# Patient Record
Sex: Female | Born: 1955 | Race: Black or African American | Hispanic: No
Health system: Southern US, Community
[De-identification: ages and names within clinical notes are randomized; demographics above are authoritative.]

---

## 1996-02-16 DIAGNOSIS — K5090 Crohn's disease, unspecified, without complications: Secondary | ICD-10-CM | POA: Insufficient documentation

## 2001-08-20 ENCOUNTER — Encounter: Payer: Self-pay | Admitting: Emergency Medicine

## 2001-08-21 ENCOUNTER — Inpatient Hospital Stay (HOSPITAL_COMMUNITY): Admission: EM | Admit: 2001-08-21 | Discharge: 2001-08-25 | Payer: Self-pay | Admitting: Emergency Medicine

## 2001-08-21 ENCOUNTER — Encounter: Payer: Self-pay | Admitting: Gastroenterology

## 2001-10-06 ENCOUNTER — Ambulatory Visit (HOSPITAL_COMMUNITY): Admission: RE | Admit: 2001-10-06 | Discharge: 2001-10-06 | Payer: Self-pay | Admitting: Gastroenterology

## 2001-10-06 ENCOUNTER — Encounter (INDEPENDENT_AMBULATORY_CARE_PROVIDER_SITE_OTHER): Payer: Self-pay | Admitting: *Deleted

## 2001-10-13 ENCOUNTER — Encounter: Payer: Self-pay | Admitting: Gastroenterology

## 2001-10-13 ENCOUNTER — Encounter: Admission: RE | Admit: 2001-10-13 | Discharge: 2001-10-13 | Payer: Self-pay | Admitting: Gastroenterology

## 2002-02-02 ENCOUNTER — Other Ambulatory Visit: Admission: RE | Admit: 2002-02-02 | Discharge: 2002-02-02 | Payer: Self-pay | Admitting: Family Medicine

## 2002-07-23 ENCOUNTER — Emergency Department (HOSPITAL_COMMUNITY): Admission: EM | Admit: 2002-07-23 | Discharge: 2002-07-24 | Payer: Self-pay | Admitting: Emergency Medicine

## 2002-07-24 ENCOUNTER — Encounter: Payer: Self-pay | Admitting: Emergency Medicine

## 2003-01-02 ENCOUNTER — Emergency Department (HOSPITAL_COMMUNITY): Admission: EM | Admit: 2003-01-02 | Discharge: 2003-01-02 | Payer: Self-pay | Admitting: Emergency Medicine

## 2003-01-02 ENCOUNTER — Encounter: Payer: Self-pay | Admitting: Emergency Medicine

## 2003-02-21 ENCOUNTER — Inpatient Hospital Stay (HOSPITAL_COMMUNITY): Admission: EM | Admit: 2003-02-21 | Discharge: 2003-02-23 | Payer: Self-pay | Admitting: Emergency Medicine

## 2003-02-21 ENCOUNTER — Encounter: Payer: Self-pay | Admitting: Emergency Medicine

## 2003-02-22 ENCOUNTER — Encounter: Payer: Self-pay | Admitting: Gastroenterology

## 2003-03-29 ENCOUNTER — Inpatient Hospital Stay (HOSPITAL_COMMUNITY): Admission: EM | Admit: 2003-03-29 | Discharge: 2003-04-02 | Payer: Self-pay | Admitting: Emergency Medicine

## 2003-10-19 ENCOUNTER — Emergency Department (HOSPITAL_COMMUNITY): Admission: EM | Admit: 2003-10-19 | Discharge: 2003-10-19 | Payer: Self-pay | Admitting: Emergency Medicine

## 2003-10-24 ENCOUNTER — Emergency Department (HOSPITAL_COMMUNITY): Admission: EM | Admit: 2003-10-24 | Discharge: 2003-10-25 | Payer: Self-pay | Admitting: Emergency Medicine

## 2003-10-26 ENCOUNTER — Inpatient Hospital Stay (HOSPITAL_COMMUNITY): Admission: EM | Admit: 2003-10-26 | Discharge: 2003-10-29 | Payer: Self-pay | Admitting: Emergency Medicine

## 2003-10-29 ENCOUNTER — Encounter: Payer: Self-pay | Admitting: Internal Medicine

## 2003-10-29 ENCOUNTER — Encounter (INDEPENDENT_AMBULATORY_CARE_PROVIDER_SITE_OTHER): Payer: Self-pay | Admitting: Specialist

## 2003-10-29 DIAGNOSIS — K5730 Diverticulosis of large intestine without perforation or abscess without bleeding: Secondary | ICD-10-CM | POA: Insufficient documentation

## 2003-11-10 ENCOUNTER — Ambulatory Visit (HOSPITAL_COMMUNITY): Admission: RE | Admit: 2003-11-10 | Discharge: 2003-11-10 | Payer: Self-pay | Admitting: Internal Medicine

## 2003-11-18 ENCOUNTER — Inpatient Hospital Stay (HOSPITAL_COMMUNITY): Admission: EM | Admit: 2003-11-18 | Discharge: 2003-11-29 | Payer: Self-pay | Admitting: Emergency Medicine

## 2003-11-24 ENCOUNTER — Encounter: Payer: Self-pay | Admitting: Internal Medicine

## 2003-11-24 ENCOUNTER — Encounter (INDEPENDENT_AMBULATORY_CARE_PROVIDER_SITE_OTHER): Payer: Self-pay | Admitting: Specialist

## 2004-02-05 ENCOUNTER — Encounter: Admission: RE | Admit: 2004-02-05 | Discharge: 2004-02-05 | Payer: Self-pay | Admitting: Internal Medicine

## 2004-03-06 ENCOUNTER — Encounter: Admission: RE | Admit: 2004-03-06 | Discharge: 2004-04-24 | Payer: Self-pay | Admitting: Internal Medicine

## 2004-03-08 ENCOUNTER — Encounter: Admission: RE | Admit: 2004-03-08 | Discharge: 2004-06-06 | Payer: Self-pay

## 2004-05-19 ENCOUNTER — Ambulatory Visit: Payer: Self-pay | Admitting: Internal Medicine

## 2004-05-20 ENCOUNTER — Inpatient Hospital Stay (HOSPITAL_COMMUNITY): Admission: EM | Admit: 2004-05-20 | Discharge: 2004-05-31 | Payer: Self-pay | Admitting: Emergency Medicine

## 2004-06-22 ENCOUNTER — Ambulatory Visit: Payer: Self-pay | Admitting: Gastroenterology

## 2004-06-28 ENCOUNTER — Ambulatory Visit: Payer: Self-pay | Admitting: Internal Medicine

## 2004-10-17 ENCOUNTER — Ambulatory Visit: Payer: Self-pay | Admitting: Internal Medicine

## 2004-11-08 ENCOUNTER — Ambulatory Visit: Payer: Self-pay | Admitting: Internal Medicine

## 2005-01-10 ENCOUNTER — Ambulatory Visit (HOSPITAL_COMMUNITY): Admission: RE | Admit: 2005-01-10 | Discharge: 2005-01-10 | Payer: Self-pay | Admitting: Internal Medicine

## 2005-01-10 ENCOUNTER — Ambulatory Visit: Payer: Self-pay | Admitting: Internal Medicine

## 2005-01-16 ENCOUNTER — Encounter: Admission: RE | Admit: 2005-01-16 | Discharge: 2005-01-16 | Payer: Self-pay | Admitting: Internal Medicine

## 2005-01-26 ENCOUNTER — Encounter: Admission: RE | Admit: 2005-01-26 | Discharge: 2005-01-26 | Payer: Self-pay | Admitting: Internal Medicine

## 2005-04-24 ENCOUNTER — Ambulatory Visit: Payer: Self-pay | Admitting: Internal Medicine

## 2005-05-25 ENCOUNTER — Ambulatory Visit: Payer: Self-pay | Admitting: Physical Medicine and Rehabilitation

## 2005-05-25 ENCOUNTER — Encounter
Admission: RE | Admit: 2005-05-25 | Discharge: 2005-08-23 | Payer: Self-pay | Admitting: Physical Medicine and Rehabilitation

## 2005-06-22 ENCOUNTER — Ambulatory Visit: Payer: Self-pay | Admitting: Physical Medicine & Rehabilitation

## 2005-07-26 ENCOUNTER — Ambulatory Visit: Payer: Self-pay | Admitting: Physical Medicine and Rehabilitation

## 2005-08-23 ENCOUNTER — Encounter
Admission: RE | Admit: 2005-08-23 | Discharge: 2005-11-21 | Payer: Self-pay | Admitting: Physical Medicine and Rehabilitation

## 2005-09-11 IMAGING — CR DG ABDOMEN ACUTE W/ 1V CHEST
4 series · 4 of 4 positions shown · non-contrast
Comparison: none

CLINICAL DATA: stomach pain, Crohn?s disease.
 ACUTE ABDOMEN W/CHEST
 Previous study is not immediately available. 
 CHEST:
 The heart and mediastinal contours are normal and the lungs are clear.
 IMPRESSION
 Normal chest.
 FLAT AND ERECT ABDOMEN
 Mild ileus pattern. No free air.  Gallbladder removed. There is a mildly dilated loop of small bowel in the right lower quadrant.  Early bowel obstruction not completely excluded.  Distal rectal gas is present.  No abnormal calcifications are seen.
 Mild ileus pattern with prominent small bowel loop right lower quadrant.  No definite obstruction or free air, however.

[view not recorded (1 of 4)]
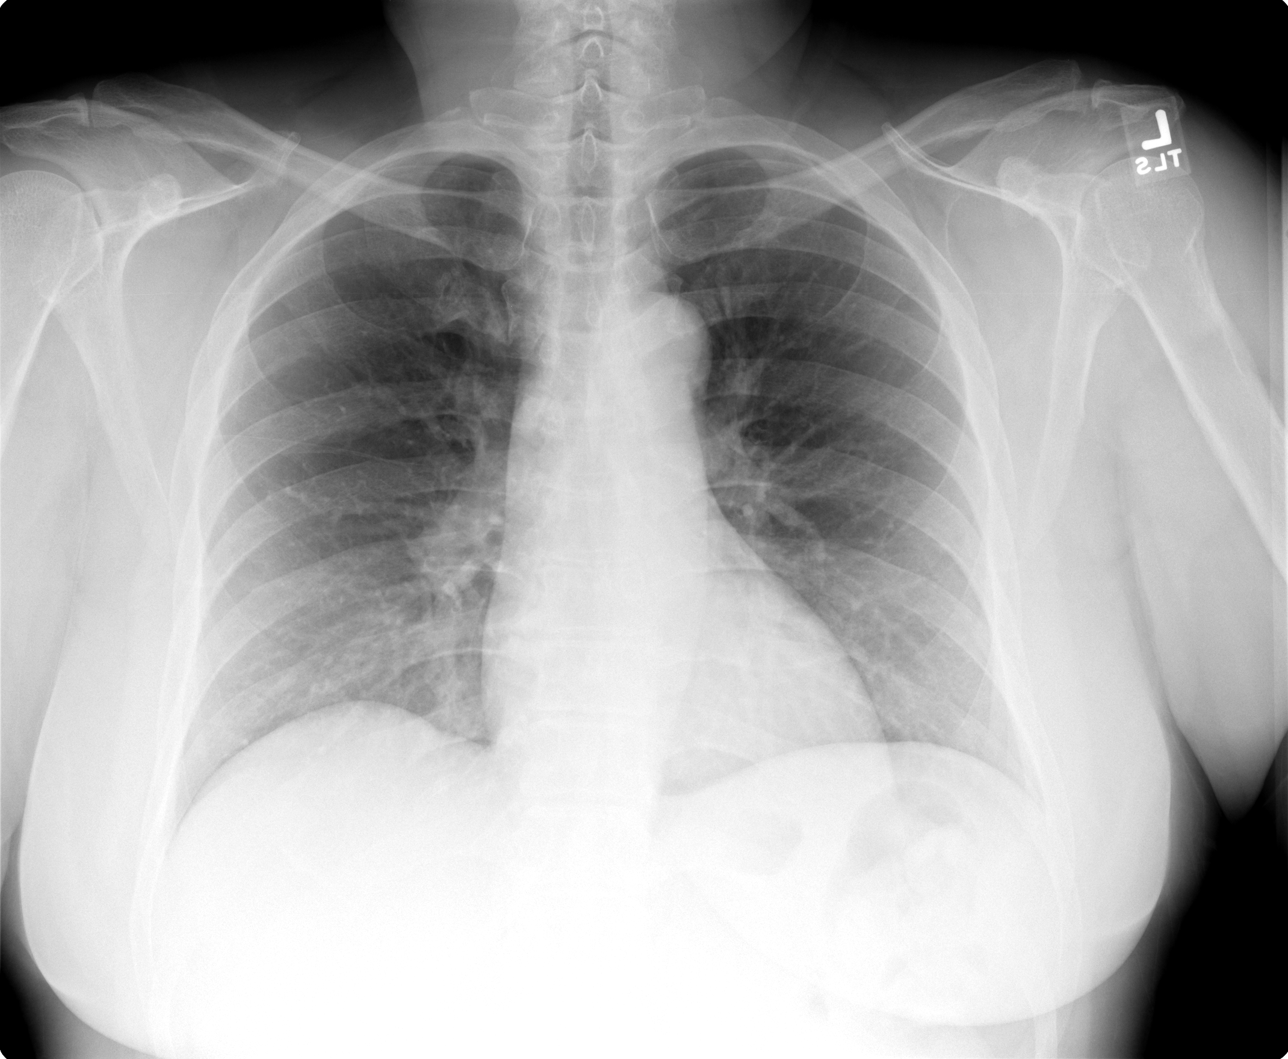

[view not recorded (2 of 4)]
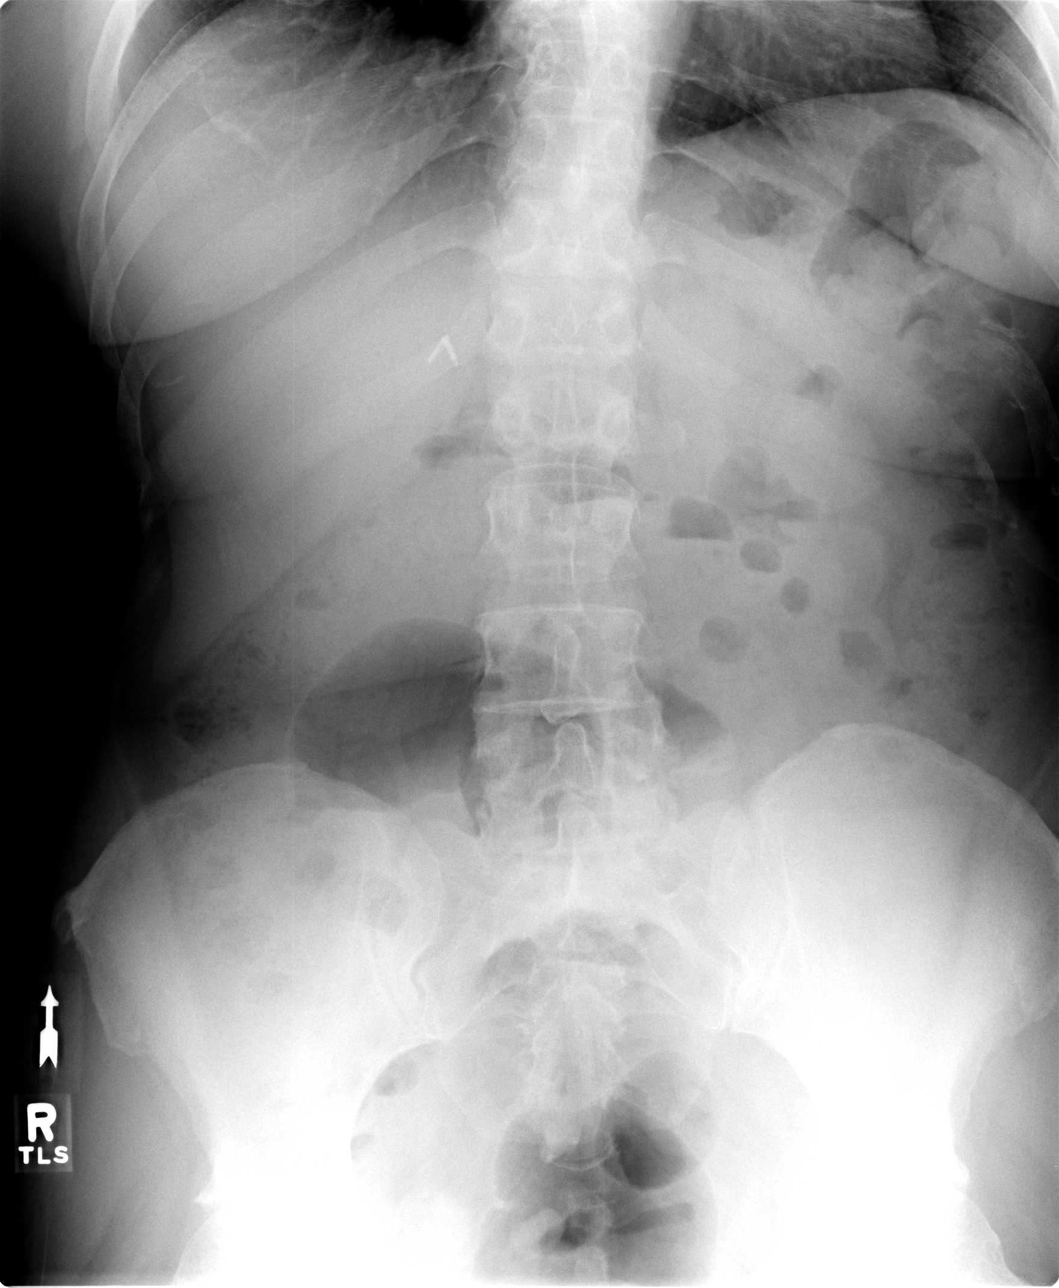

[view not recorded (3 of 4)]
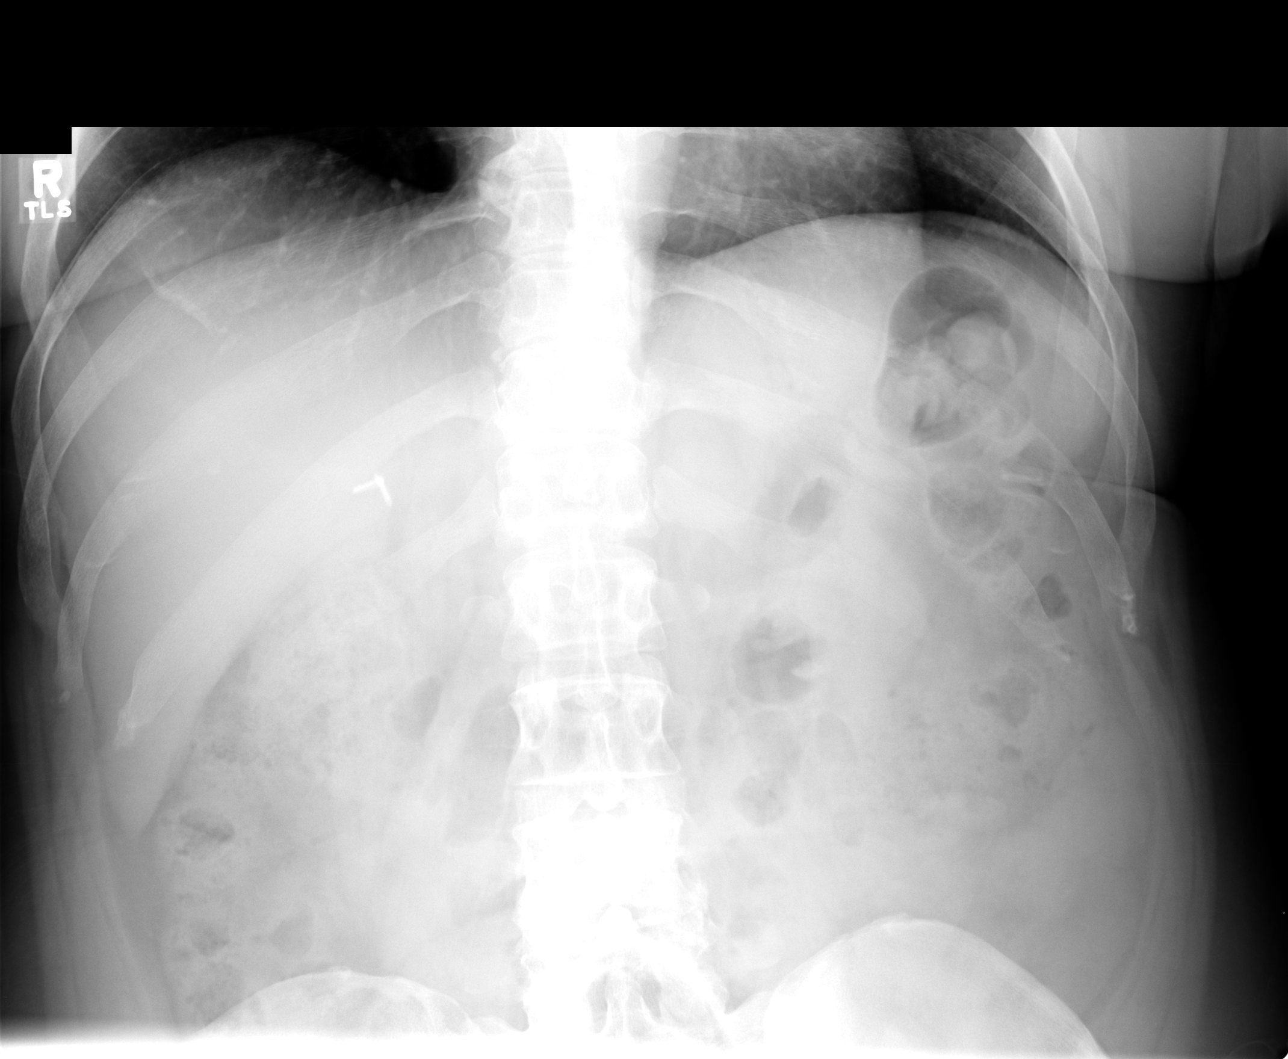

[view not recorded (4 of 4)]
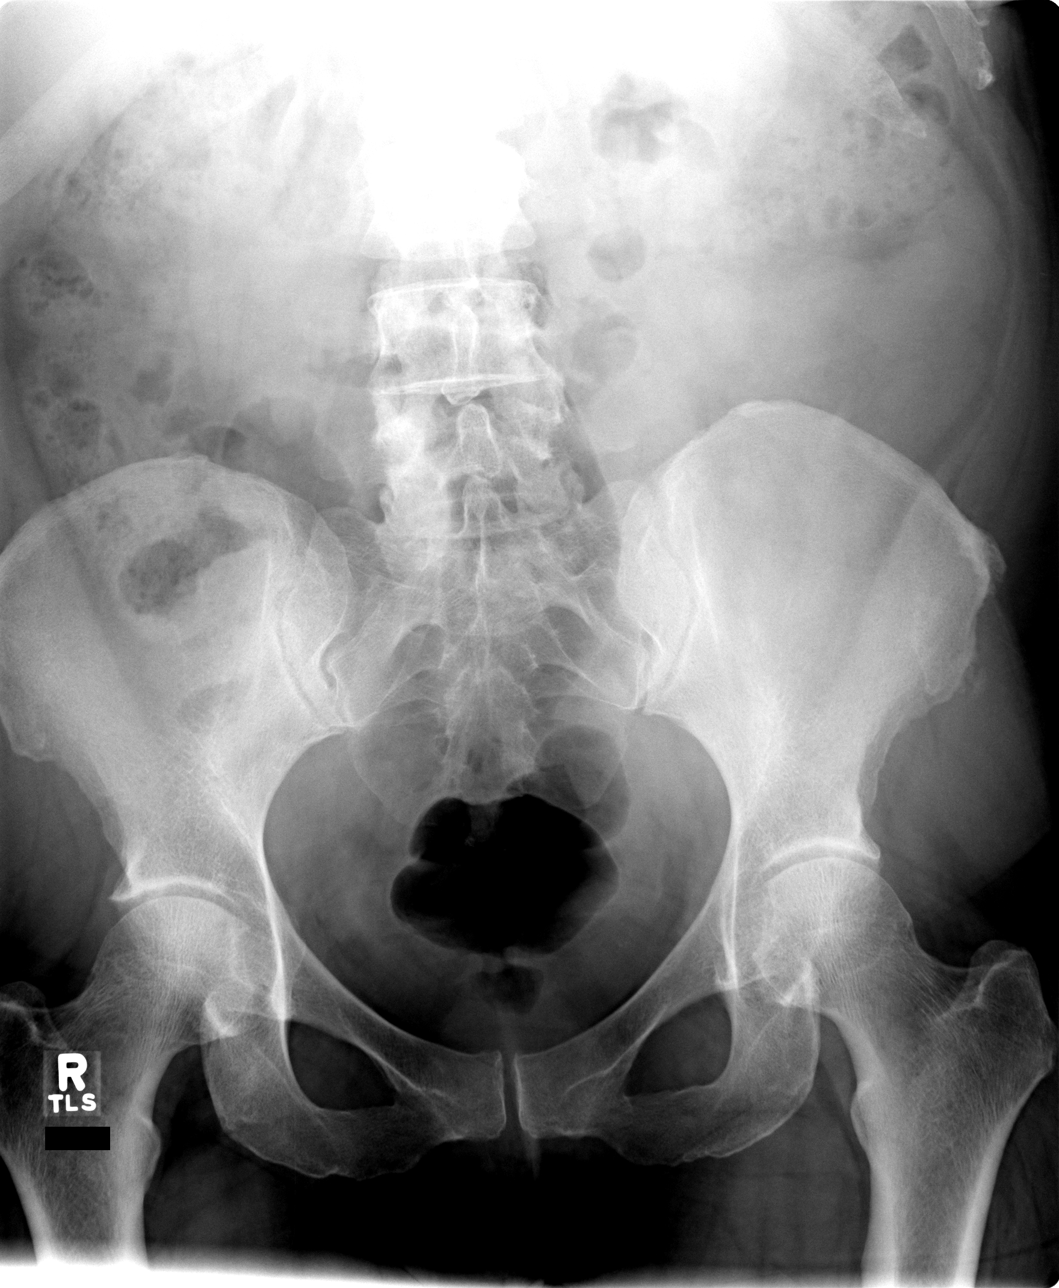

[4 of 4 positions shown; findings below may reference images not displayed]

## 2005-09-20 ENCOUNTER — Ambulatory Visit: Payer: Self-pay | Admitting: Physical Medicine and Rehabilitation

## 2005-10-05 ENCOUNTER — Ambulatory Visit: Payer: Self-pay | Admitting: *Deleted

## 2005-10-18 ENCOUNTER — Ambulatory Visit: Payer: Self-pay | Admitting: Nurse Practitioner

## 2005-12-04 ENCOUNTER — Ambulatory Visit: Payer: Self-pay | Admitting: Nurse Practitioner

## 2005-12-26 ENCOUNTER — Ambulatory Visit: Payer: Self-pay | Admitting: Nurse Practitioner

## 2006-02-07 ENCOUNTER — Emergency Department (HOSPITAL_COMMUNITY): Admission: EM | Admit: 2006-02-07 | Discharge: 2006-02-08 | Payer: Self-pay | Admitting: Emergency Medicine

## 2006-02-25 ENCOUNTER — Ambulatory Visit: Payer: Self-pay | Admitting: Nurse Practitioner

## 2006-07-05 ENCOUNTER — Ambulatory Visit: Payer: Self-pay | Admitting: Nurse Practitioner

## 2006-07-11 ENCOUNTER — Ambulatory Visit (HOSPITAL_COMMUNITY): Admission: RE | Admit: 2006-07-11 | Discharge: 2006-07-11 | Payer: Self-pay | Admitting: Family Medicine

## 2006-08-01 ENCOUNTER — Inpatient Hospital Stay (HOSPITAL_COMMUNITY): Admission: RE | Admit: 2006-08-01 | Discharge: 2006-08-05 | Payer: Self-pay | Admitting: Neurosurgery

## 2006-10-17 ENCOUNTER — Ambulatory Visit: Payer: Self-pay | Admitting: Family Medicine

## 2006-10-18 ENCOUNTER — Ambulatory Visit (HOSPITAL_COMMUNITY): Admission: RE | Admit: 2006-10-18 | Discharge: 2006-10-18 | Payer: Self-pay | Admitting: Internal Medicine

## 2006-11-26 ENCOUNTER — Ambulatory Visit: Payer: Self-pay | Admitting: Internal Medicine

## 2006-12-04 IMAGING — CR DG HIP (WITH OR WITHOUT PELVIS) 2-3V*L*
3 series · 3 of 3 positions shown · non-contrast
Comparison: None.
COMPARISON: None.

CLINICAL DATA: Left hip and back pain ? no known injury. 
 LEFT HIP ? 3 VIEWS:

[t pelvis a.p.]
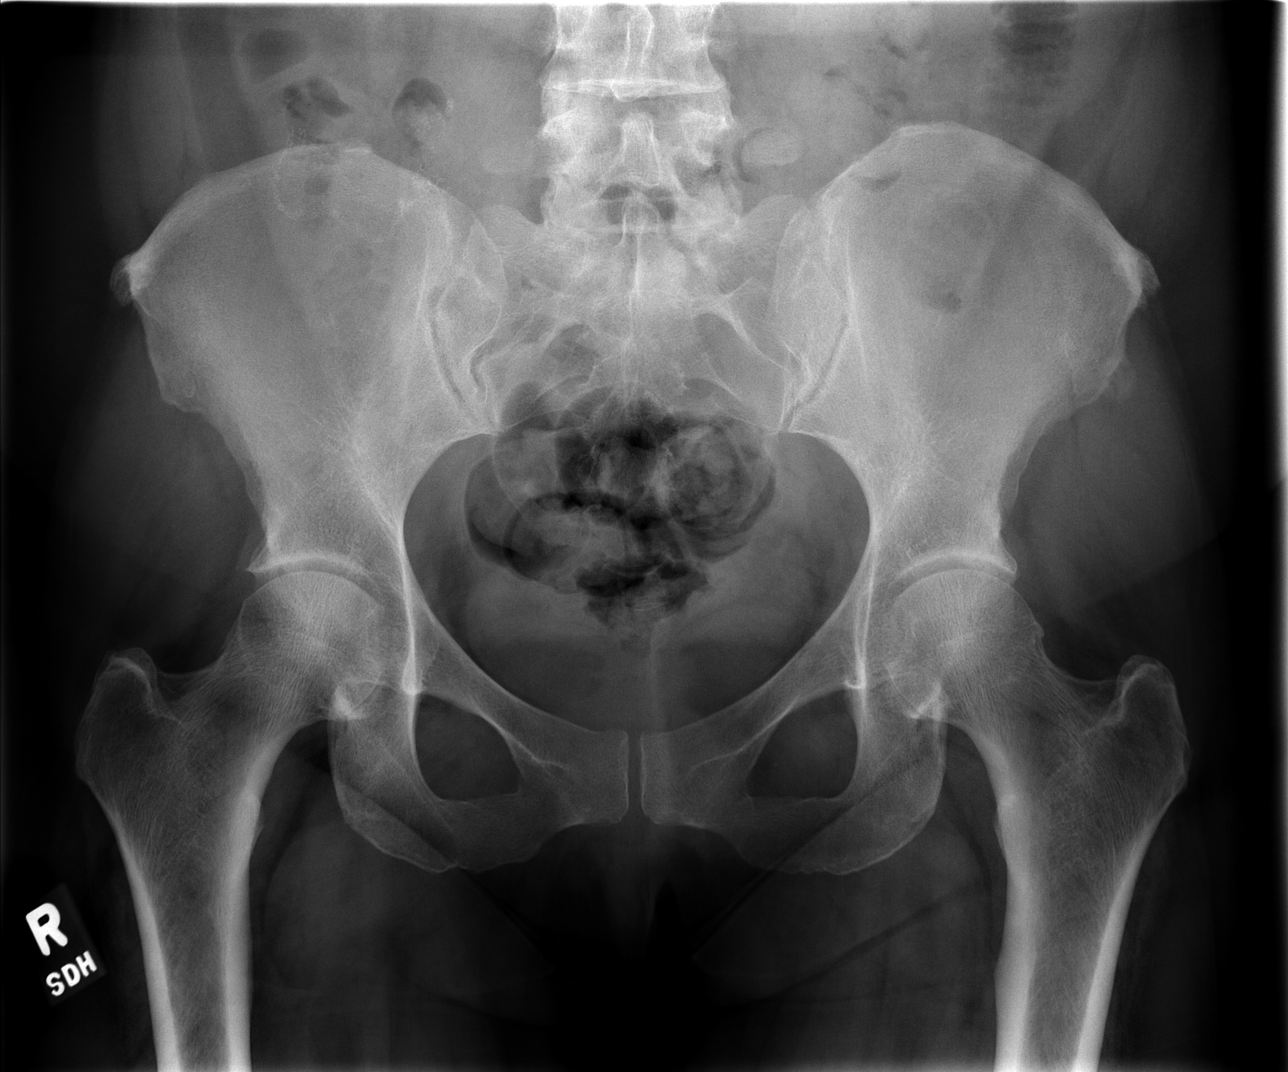

[t hip ap left]
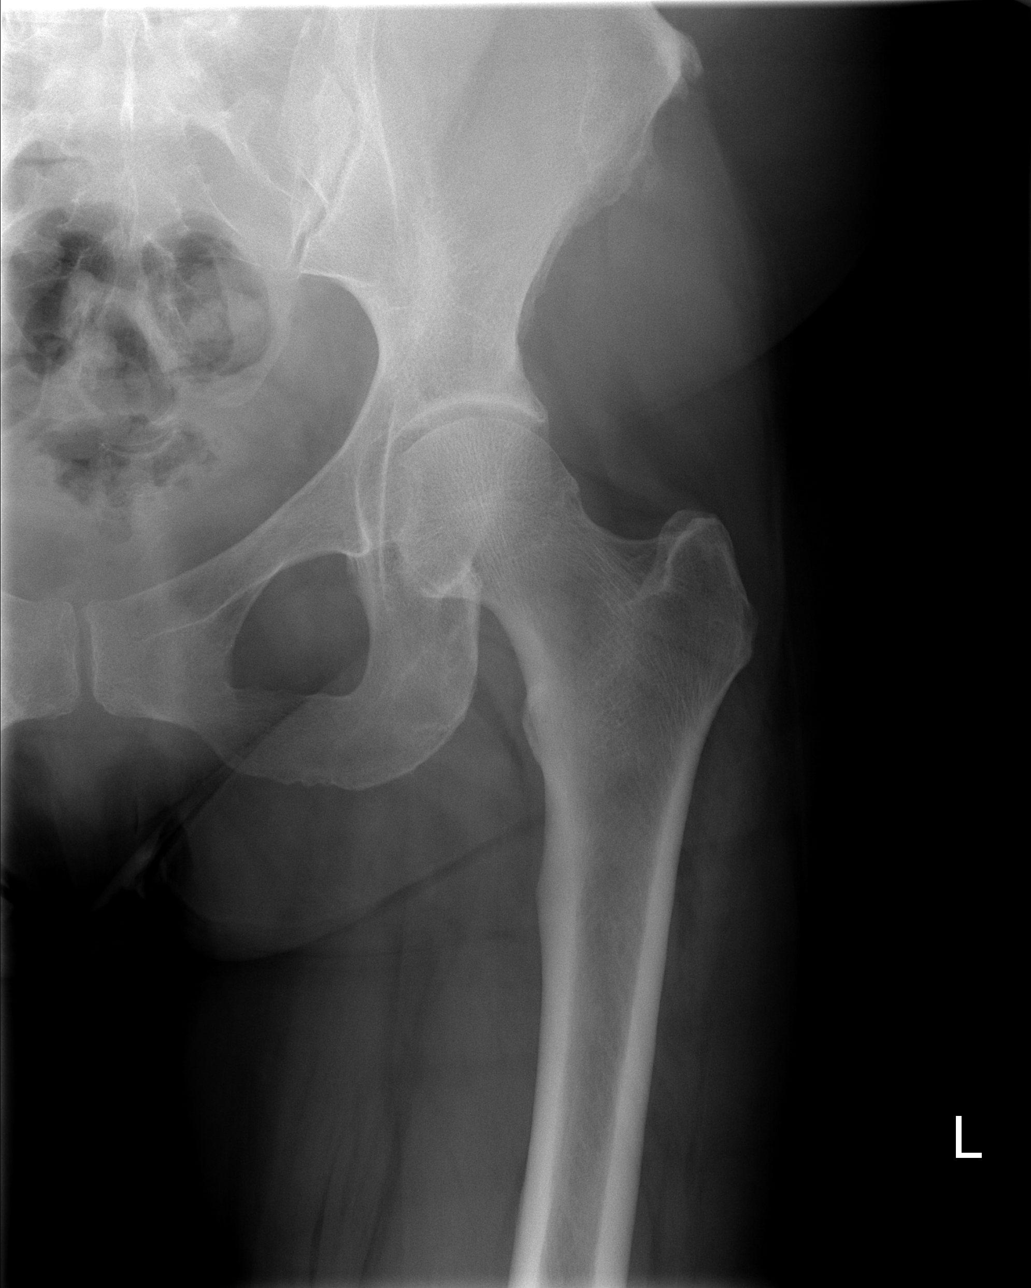

[t hip frog leg left]
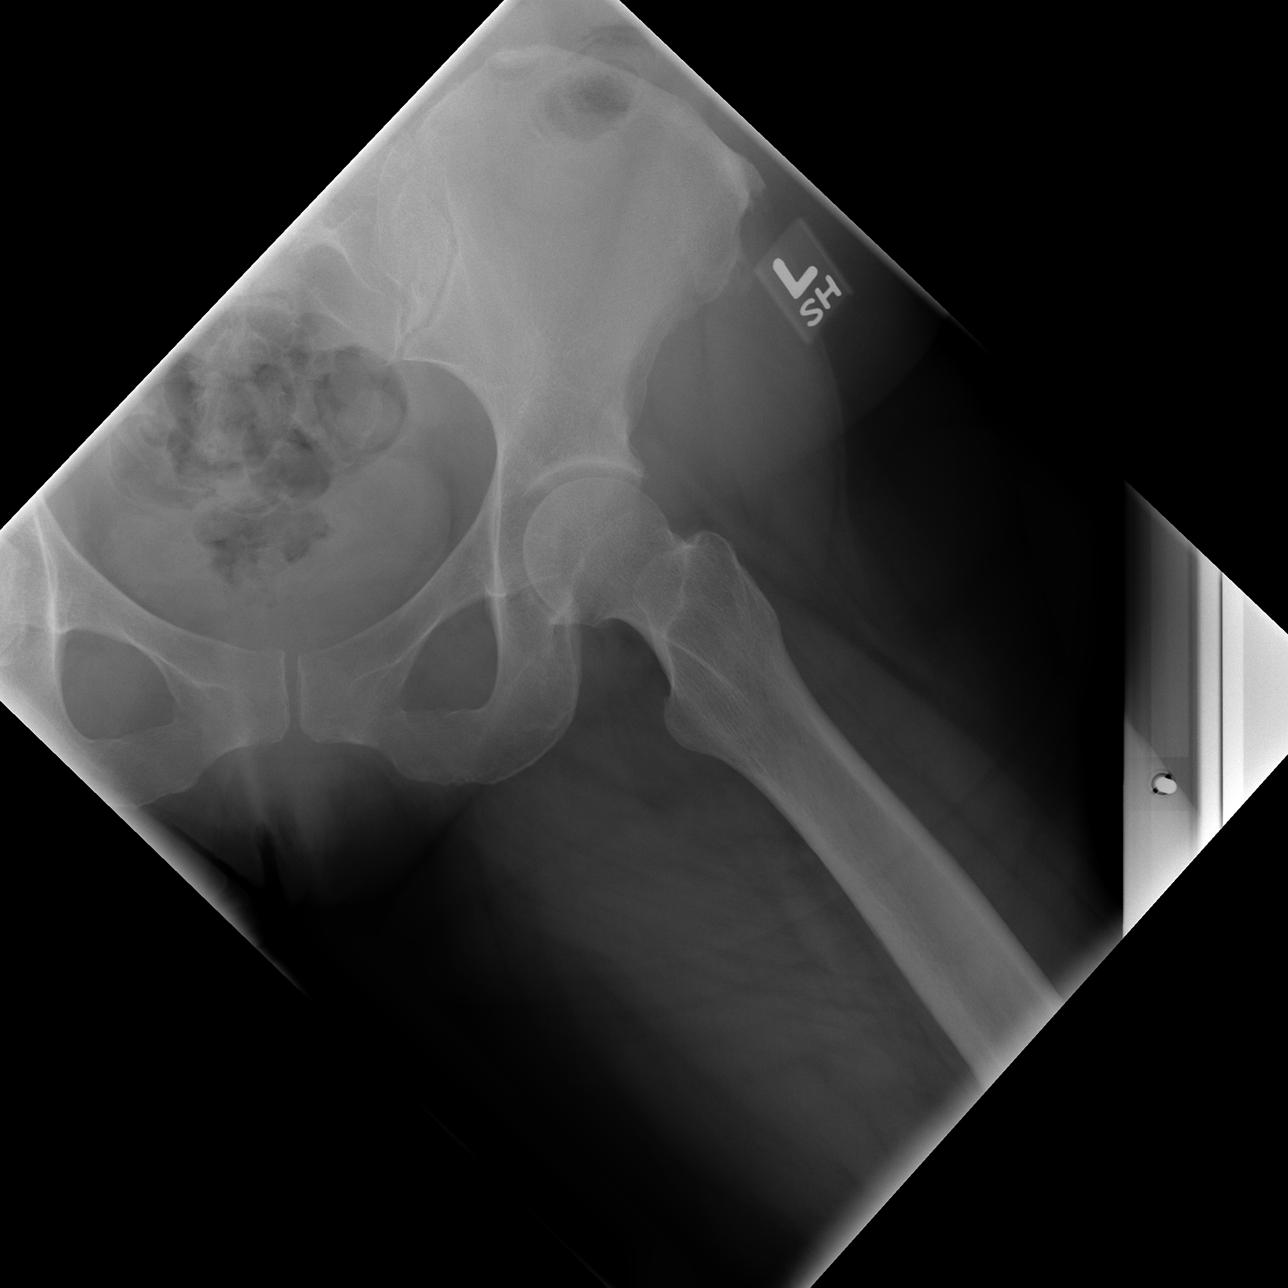

[3 of 3 positions shown; findings below may reference images not displayed]

FINDINGS: No fracture or bony lesions.  No significant osteoarthritis.  Bony pelvic intact.  Soft tissues unremarkable ? there are bowel staples in the right lower quadrant.
IMPRESSION: No acute or significant finding.  
 LUMBAR SPINE ? 4 VIEWS:
FINDINGS: There is about an 8 mm anterolisthesis of L4 on L5 with degenerative disc disease.  There appear to be no pars defects.  This mild degree of anterolisthesis is probably due to facet arthropathy.  There also appears to be some degenerative disc disease at L5-S1.  No acute findings.
IMPRESSION: 1.  Degenerative disc disease at L4-5 and L5-S1.  
 2.  Grade I anterolisthesis of L4 on L5 ? likely due to facet arthropathy.  
 3.  No acute findings.

## 2006-12-06 ENCOUNTER — Ambulatory Visit: Payer: Self-pay | Admitting: Internal Medicine

## 2006-12-06 DIAGNOSIS — K649 Unspecified hemorrhoids: Secondary | ICD-10-CM | POA: Insufficient documentation

## 2006-12-11 ENCOUNTER — Ambulatory Visit: Payer: Self-pay | Admitting: Family Medicine

## 2006-12-11 ENCOUNTER — Ambulatory Visit (HOSPITAL_COMMUNITY): Admission: RE | Admit: 2006-12-11 | Discharge: 2006-12-11 | Payer: Self-pay | Admitting: Family Medicine

## 2006-12-16 ENCOUNTER — Ambulatory Visit: Payer: Self-pay | Admitting: Internal Medicine

## 2006-12-31 ENCOUNTER — Encounter: Payer: Self-pay | Admitting: Internal Medicine

## 2007-01-22 ENCOUNTER — Encounter (INDEPENDENT_AMBULATORY_CARE_PROVIDER_SITE_OTHER): Payer: Self-pay | Admitting: *Deleted

## 2007-06-30 LAB — CONVERTED CEMR LAB: Pap Smear: NORMAL

## 2007-07-16 ENCOUNTER — Ambulatory Visit: Payer: Self-pay | Admitting: Internal Medicine

## 2007-08-20 DIAGNOSIS — I10 Essential (primary) hypertension: Secondary | ICD-10-CM | POA: Insufficient documentation

## 2007-09-12 ENCOUNTER — Emergency Department (HOSPITAL_COMMUNITY): Admission: EM | Admit: 2007-09-12 | Discharge: 2007-09-12 | Payer: Self-pay | Admitting: Emergency Medicine

## 2007-10-20 ENCOUNTER — Ambulatory Visit: Payer: Self-pay | Admitting: Internal Medicine

## 2007-11-12 ENCOUNTER — Ambulatory Visit (HOSPITAL_COMMUNITY): Admission: RE | Admit: 2007-11-12 | Discharge: 2007-11-12 | Payer: Self-pay | Admitting: Family Medicine

## 2008-02-12 ENCOUNTER — Encounter: Payer: Self-pay | Admitting: Internal Medicine

## 2008-02-16 ENCOUNTER — Ambulatory Visit: Payer: Self-pay | Admitting: Internal Medicine

## 2008-02-16 DIAGNOSIS — K6289 Other specified diseases of anus and rectum: Secondary | ICD-10-CM | POA: Insufficient documentation

## 2008-02-16 DIAGNOSIS — R1031 Right lower quadrant pain: Secondary | ICD-10-CM | POA: Insufficient documentation

## 2008-02-16 DIAGNOSIS — R198 Other specified symptoms and signs involving the digestive system and abdomen: Secondary | ICD-10-CM | POA: Insufficient documentation

## 2008-02-16 DIAGNOSIS — K625 Hemorrhage of anus and rectum: Secondary | ICD-10-CM | POA: Insufficient documentation

## 2008-02-16 LAB — CONVERTED CEMR LAB
ALT: 18 units/L (ref 0–35)
Alkaline Phosphatase: 99 units/L (ref 39–117)
Basophils Absolute: 0 10*3/uL (ref 0.0–0.1)
CO2: 26 meq/L (ref 19–32)
Calcium: 9.1 mg/dL (ref 8.4–10.5)
Creatinine, Ser: 1.6 mg/dL — ABNORMAL HIGH (ref 0.4–1.2)
GFR calc Af Amer: 44 mL/min
Glucose, Bld: 104 mg/dL — ABNORMAL HIGH (ref 70–99)
HCT: 38.1 % (ref 36.0–46.0)
MCV: 94.1 fL (ref 78.0–100.0)
Neutrophils Relative %: 62.5 % (ref 43.0–77.0)
Platelets: 252 10*3/uL (ref 150–400)
Potassium: 3.8 meq/L (ref 3.5–5.1)
RBC: 4.05 M/uL (ref 3.87–5.11)
Total Bilirubin: 0.6 mg/dL (ref 0.3–1.2)
Total Protein: 7.5 g/dL (ref 6.0–8.3)
WBC: 5.9 10*3/uL (ref 4.5–10.5)

## 2008-02-17 ENCOUNTER — Ambulatory Visit: Payer: Self-pay | Admitting: Internal Medicine

## 2008-02-17 ENCOUNTER — Encounter: Payer: Self-pay | Admitting: Internal Medicine

## 2008-02-19 ENCOUNTER — Telehealth: Payer: Self-pay | Admitting: Internal Medicine

## 2008-02-19 DIAGNOSIS — R195 Other fecal abnormalities: Secondary | ICD-10-CM | POA: Insufficient documentation

## 2008-02-26 ENCOUNTER — Encounter: Payer: Self-pay | Admitting: Internal Medicine

## 2008-02-27 ENCOUNTER — Ambulatory Visit: Payer: Self-pay | Admitting: Internal Medicine

## 2008-03-04 ENCOUNTER — Telehealth: Payer: Self-pay | Admitting: Internal Medicine

## 2008-05-03 ENCOUNTER — Ambulatory Visit: Payer: Self-pay | Admitting: Internal Medicine

## 2008-05-13 ENCOUNTER — Encounter: Admission: RE | Admit: 2008-05-13 | Discharge: 2008-06-02 | Payer: Self-pay | Admitting: Internal Medicine

## 2008-06-01 ENCOUNTER — Ambulatory Visit: Payer: Self-pay | Admitting: Internal Medicine

## 2008-08-24 ENCOUNTER — Ambulatory Visit: Payer: Self-pay | Admitting: Family Medicine

## 2008-08-24 ENCOUNTER — Encounter (INDEPENDENT_AMBULATORY_CARE_PROVIDER_SITE_OTHER): Payer: Self-pay | Admitting: Internal Medicine

## 2008-08-24 LAB — CONVERTED CEMR LAB
ALT: 21 units/L (ref 0–35)
BUN: 19 mg/dL (ref 6–23)
Chloride: 100 meq/L (ref 96–112)
Creatinine, Ser: 1.24 mg/dL — ABNORMAL HIGH (ref 0.40–1.20)
Glucose, Bld: 97 mg/dL (ref 70–99)
Total Bilirubin: 0.3 mg/dL (ref 0.3–1.2)

## 2008-09-15 ENCOUNTER — Encounter: Admission: RE | Admit: 2008-09-15 | Discharge: 2008-09-15 | Payer: Self-pay | Admitting: Neurosurgery

## 2008-09-23 ENCOUNTER — Ambulatory Visit (HOSPITAL_BASED_OUTPATIENT_CLINIC_OR_DEPARTMENT_OTHER): Admission: RE | Admit: 2008-09-23 | Discharge: 2008-09-23 | Payer: Self-pay | Admitting: Internal Medicine

## 2008-09-23 ENCOUNTER — Ambulatory Visit: Payer: Self-pay | Admitting: Radiology

## 2008-09-23 ENCOUNTER — Ambulatory Visit: Payer: Self-pay | Admitting: Internal Medicine

## 2008-09-23 DIAGNOSIS — L259 Unspecified contact dermatitis, unspecified cause: Secondary | ICD-10-CM | POA: Insufficient documentation

## 2008-09-23 DIAGNOSIS — M545 Low back pain, unspecified: Secondary | ICD-10-CM | POA: Insufficient documentation

## 2008-09-23 DIAGNOSIS — M25569 Pain in unspecified knee: Secondary | ICD-10-CM | POA: Insufficient documentation

## 2008-10-04 ENCOUNTER — Telehealth: Payer: Self-pay | Admitting: Internal Medicine

## 2008-10-27 ENCOUNTER — Encounter: Payer: Self-pay | Admitting: Internal Medicine

## 2008-10-28 ENCOUNTER — Encounter (INDEPENDENT_AMBULATORY_CARE_PROVIDER_SITE_OTHER): Payer: Self-pay | Admitting: *Deleted

## 2008-11-10 ENCOUNTER — Ambulatory Visit: Payer: Self-pay | Admitting: Physical Medicine and Rehabilitation

## 2009-01-11 ENCOUNTER — Encounter
Admission: RE | Admit: 2009-01-11 | Discharge: 2009-01-14 | Payer: Self-pay | Admitting: Physical Medicine and Rehabilitation

## 2009-01-14 ENCOUNTER — Ambulatory Visit: Payer: Self-pay | Admitting: Physical Medicine and Rehabilitation

## 2009-02-14 ENCOUNTER — Ambulatory Visit: Payer: Self-pay | Admitting: Internal Medicine

## 2009-02-14 LAB — CONVERTED CEMR LAB
BUN: 21 mg/dL (ref 6–23)
Bilirubin, Direct: 0.1 mg/dL (ref 0.0–0.3)
CO2: 28 meq/L (ref 19–32)
Chloride: 104 meq/L (ref 96–112)
Glucose, Bld: 100 mg/dL — ABNORMAL HIGH (ref 70–99)
Indirect Bilirubin: 0.1 mg/dL (ref 0.0–0.9)
LDL Cholesterol: 124 mg/dL — ABNORMAL HIGH (ref 0–99)
Potassium: 4.6 meq/L (ref 3.5–5.3)
Total Bilirubin: 0.2 mg/dL — ABNORMAL LOW (ref 0.3–1.2)
Total Protein: 7.3 g/dL (ref 6.0–8.3)
VLDL: 25 mg/dL (ref 0–40)
Vit D, 1,25-Dihydroxy: 13 — ABNORMAL LOW (ref 30–89)
Vitamin B-12: 432 pg/mL (ref 211–911)

## 2009-02-15 ENCOUNTER — Encounter: Payer: Self-pay | Admitting: Internal Medicine

## 2009-02-15 ENCOUNTER — Telehealth: Payer: Self-pay | Admitting: Internal Medicine

## 2009-02-15 DIAGNOSIS — E559 Vitamin D deficiency, unspecified: Secondary | ICD-10-CM | POA: Insufficient documentation

## 2009-02-17 ENCOUNTER — Ambulatory Visit (HOSPITAL_BASED_OUTPATIENT_CLINIC_OR_DEPARTMENT_OTHER): Admission: RE | Admit: 2009-02-17 | Discharge: 2009-02-17 | Payer: Self-pay | Admitting: Internal Medicine

## 2009-02-17 ENCOUNTER — Ambulatory Visit: Payer: Self-pay | Admitting: Radiology

## 2009-02-17 ENCOUNTER — Telehealth: Payer: Self-pay | Admitting: Internal Medicine

## 2009-02-17 ENCOUNTER — Ambulatory Visit: Payer: Self-pay | Admitting: Internal Medicine

## 2009-02-17 DIAGNOSIS — N259 Disorder resulting from impaired renal tubular function, unspecified: Secondary | ICD-10-CM | POA: Insufficient documentation

## 2009-03-25 ENCOUNTER — Encounter
Admission: RE | Admit: 2009-03-25 | Discharge: 2009-04-28 | Payer: Self-pay | Admitting: Physical Medicine and Rehabilitation

## 2009-03-28 ENCOUNTER — Ambulatory Visit: Payer: Self-pay | Admitting: Physical Medicine and Rehabilitation

## 2009-05-27 ENCOUNTER — Ambulatory Visit: Payer: Self-pay | Admitting: Physical Medicine and Rehabilitation

## 2009-05-27 ENCOUNTER — Encounter
Admission: RE | Admit: 2009-05-27 | Discharge: 2009-07-18 | Payer: Self-pay | Admitting: Physical Medicine and Rehabilitation

## 2009-07-15 ENCOUNTER — Ambulatory Visit: Payer: Self-pay | Admitting: Physical Medicine and Rehabilitation

## 2009-08-16 ENCOUNTER — Telehealth: Payer: Self-pay | Admitting: Internal Medicine

## 2010-05-27 ENCOUNTER — Encounter: Payer: Self-pay | Admitting: Orthopedic Surgery

## 2010-05-29 ENCOUNTER — Encounter: Payer: Self-pay | Admitting: Internal Medicine

## 2010-06-06 NOTE — Progress Notes (Signed)
Summary: Moved to [facility]  Phone Note Outgoing Call   Call placed by: Glendell Docker CMA,  August 16, 2009 5:00 PM Call placed to: Patient Summary of Call: patient called at [phone], as a reminder when refilling a rx for her , that she is scheduled for Thursday with Dr Artist Pais and blood work was needed. Patient states that she has moved to [place] and states she would have loved to keep Dr Artist Pais as her primary however,she has to find someone in the [place] area. She asked that I cancell her appointment. Patient advised appointment would be cancelled Initial call taken by: Glendell Docker CMA,  August 16, 2009 5:04 PM

## 2010-09-19 NOTE — Assessment & Plan Note (Signed)
[facility]                         GASTROENTEROLOGY OFFICE NOTE   NAME:Stewart, Betty MAUSOLF                     MRN:          [id number]  DATE:11/26/2006                            DOB:          07/03/1955    CHIEF COMPLAINT:  Constipation and changing bowel habits with diarrhea  and rectal bleeding.   ASSESSMENT:  A 55 year old [place] American woman with a history of  Crohn's disease status post resection 07/05 as well as subsequent lysis  of adhesions 01/06.  She has not really had gastroenterology care for a  number of years, previously followed by Dr. Russella Dar, sent to me because  [facility] thought that I was the only one taking [facility] patients  (question if she is a project ACCESS patient).  At this point she is  complaining of alternating bowel habits of constipation and diarrhea,  and she has been having some rectal bleeding, though not really in a  while.  It has been a number of years since she has had any type of  endoscopic evaluation, in fact, I was the last one to do it in 2005 when  she was hospitalized with a negative EGD and edema of the distal  terminal ileum.   PLAN:  Schedule colonoscopy to re-stage her Crohn's disease and sort out  why she has the alternating bowel habits.  Certainly could have  irritable bowel syndrome.  Bacterial overgrowth is possible.  Active  Crohn's disease is possible.  She is on Asacol at this time but she is  really only using it p.r.n. so that is not adequate therapy and if she  only has small bowel disease it may not be adequate therapy.  She is  able to get that through [facility].  Apparently she had been on  Pentasa at one point as well.  She may need other medication.   HISTORY:  As above, see medical history form.   PAST MEDICAL HISTORY/PROBLEMS:  1. Crohn's disease, previously followed by Dr. Russella Dar with resection      and lysis of adhesions as above.  2. Hypertension.  3. Prior history of  pancreatitis.  4. Prior cholecystectomy.  5. Prior appendectomy.  6. Cesarean section x2.  7. Prior total abdominal hysterectomy with unilateral salpingo-      oophorectomy.   MEDICATIONS:  1. Lisinopril HCTZ 20/12.5 mg two each day.  2. Asacol 400 mg intermittently.  3. Topamax 100 mg b.i.d.  4. Estradiol 1 mg daily.  5. Rozarem 8 mg p.r.n.  6. Pravachol 40 mg daily.  7. Tramadol p.r.n.  8. Dicyclomine 10 mg p.r.n.   ADDITIONAL PROBLEMS:  1. SHE IS ALLERGIC TO DILAUDID (PRURITUS ONLY, NO RASH).  2. Headaches.  3. Has had partial parathyroidectomy.   SOCIAL HISTORY:  The patient lost her insurance at some point after her  surgeries and has been a patient at [facility], the loss of insurance  was actually related to a divorce I think.   She denies alcohol or tobacco.  She has worked at [employer] in the past I  believe, I do not think she is working there now.  She currently lists  herself as separated, not really divorced.  She has a college degree.   REVIEW OF SYSTEMS:  Eyeglasses, urinary difficulty at times, back pain,  headaches, joint pains.   PHYSICAL EXAMINATION:  Reveals a well-developed, overweight to obese  black woman.  Height 5 foot 4, weight 181 pounds, pulse 84, blood  pressure 120/70.  She has very poor dentition, otherwise oropharynx and  posterior pharynx are free of lesions.  LUNGS:  Clear.  HEART:  S1-S2, no rubs or gallops.  ABDOMEN:  Soft, nontender, no organomegaly or mass.  There is no perianal fistula or problems noted.  She has Hemoccult  negative hard brown stool in the rectal vault.  Rectal exam performed in  the presence of female nursing staff.  LYMPH NODES:  No neck or supraclavicular nodes palpated.  SKIN:  Warm and dry, no acute rash.  PSYCH:  She is alert and oriented x3.   DATA REVIEWED:  [facility] information sent, she has recent labs  demonstrating a normal CBC on October 17, 2006, alkaline phosphatase 123  and otherwise normal CMET.   Cholesterol 226, LDL 129, TSH 0.576,  hemoglobin A1c 5.9%.  Labs in June 2007 normal CBC as well.   Appreciate the opportunity to care for this patient.     Iva Boop, MD,FACG  Electronically Signed    CEG/MedQ  DD: 11/26/2006  DT: 11/27/2006  Job #: [id number]   cc:   Duke Salvia, NP

## 2010-09-19 NOTE — Assessment & Plan Note (Signed)
[name] is a pleasant 55 year old divorced African American  female, who was kindly referred back to our Pain and  Rehabilitative  Clinic for management of the low back pain.   [name] was last seen by me in this clinic back in May 2007.  At  that time, she was having some financial constraints and eventually got  involved with the [facility] at [facility].   In the interim, she has seen Dr. Maeola Harman, [facility],  who operated on her in March 2008.  At that time, she had an L4-5  transforaminal lumbar interbody fusion with the PEEK cage and a  posterior lateral arthrodesis at L4-5.   She was recently seen by Dr. Venetia Maxon again earlier this spring, Sep 15, 2008, he ordered an lumbar MRI, which showed some mild disk degeneration  in fact and degeneration at L5-S1 and at L3-4, which is above and below  the L4-5 fusion.   With respect to her pain, she states her average pain is about 5 or 6 on  a scale of 10.  She is an active woman and is involved in working out.  She uses an elliptical trainer and also a treadmill.  She enjoys the  treadmill a bit more and works out 45 minutes to an hour at least 3-4  days a week.  She does get pain and increased low back pain, if she  exercises on consecutive days.   She reports overall rather good relief with the tramadol she is taking.  She uses about 3 tablets a day and finds that it gives her fairly good  relief with her pain and allows her to continue to exercise.  She has  some concerns about continuing to take this medication.  Apparently, she  had an episode, where she developed some withdrawal symptoms when it was  not available to her.  At one point, she worried her that she was asking  about hydrocodone and oxycodone, wondering whether these would give her  the same symptoms, should she discontinue them.   She reports that her pain interferes very little with her general  activity moderately with enjoyment of  life.  Overall, she is very  functional, walks without assistance, can go 45 minutes to an hour with  medications.  She climbs stairs.  She is independent with self-care.   REVIEW OF SYSTEMS:  Negative for problems controlling bowel or bladder.  Denies numbness, tingling, or tremors.  Denies spasms, dizziness,  depression, or anxiety.  Denies suicidal ideation.  Denies weakness.   Admits to some constipation and occasional abdominal pain.   Physicians currently involved in her care; Dr. Artist Pais and Dr. Venetia Maxon.   PAST MEDICAL HISTORY:  Positive for hypertension, Crohn disease,  ileocolitis, osteoarthritis, and pancreatitis.   PAST SURGICAL HISTORY:  Positive for appendectomy, cholecystectomy,  ileocecal resection, or Crohn stricture, in July 2005; hysterectomy,  unilateral salpingo-oophorectomy, C-section x2, lysis of adhesions with  exploratory lap in 2006, and lumbar fusion Dr. Maeola Harman in 2008 at  L4-5.  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   SOCIAL HISTORY:  The patient is currently divorced.  She lives alone.  Denies illegal substance use.  Denies smoking.  Denies alcohol use.   FAMILY HISTORY:  Positive for heart disease, diabetes, and hypertension.   PHYSICAL EXAMINATION:  VITAL SIGNS:  Today blood pressure, pulse, and  respiration were not recorded by nursing staff today.  GENERAL:  On  exam, she is a mildly obese [place] female, who  does not appear in any distress.  She is oriented x3.  Speech is clear.  Affect is bright.  She is alert, cooperative, and pleasant.  Follows  commands without difficulty, answers my questions appropriately.  NEUROLOGIC:  Cranial nerves coordination are intact.  Reflexes are  diminished in both upper and lower extremities.  No abnormal tone is  noted.  No clonus is noted.  No tremors are appreciated.   Sensory exam to pinprick, light touch, and vibratory sense are all  intact in the lower extremities.    EXTREMITIES:  Manual muscle testing reveals 5/5 strength in the lower  extremities at hip flexors, knee extensors, dorsiflexors, plantar  flexors, EHL and evertors.  Straight leg raise is negative.   Transitioning from sitting to standing is done with ease gait in the  room is not antalgic.  Tandem gait.  Romberg test are all performed  adequately.   She has full range of motion with respect to cervical spine.  She has  full shoulder range of motion.  Lumbar motion reveals a good forward  flexion without pain today and mildly limited lumbar extension without  significant pain.  Palpation does not reveal any tenderness in lumbar  paraspinal musculature.  She has a well-healed midline lumbar scar from  her previous L4-5 fusion.   IMPRESSION:  1. Status post L4-5 fusion, Dr. Venetia Maxon, August 01, 2006.  2. Mild disk degeneration, mild facet arthrosis at L3-4, and mild disk      degeneration and facet arthrosis at L5-S1.  3. History of right knee tricompartmental osteoarthritis.  4. Obesity.  5. Lumbago secondary to above.   PLAN:  Reviewed treatment options with [name] today.  Discussed  conservative measures, which would include TENS unit, lumbar support,  and Physical Therapy.  She is interested in a lumbar support and  possibly considering some physical therapy in September, when she comes  back for recheck.  She has been taking tramadol up to 3 times today,  reports good relief with it, discussed other medications.  She does not  appear to be interested in other medications such as hydrocodone or  Percocet at this time and she finds this to be treating her pain  adequately.   We discussed more invasive means to manage her low back pain such as  medial branch blocks, moving toward radiofrequency, should she become  more symptomatic with respect to her low back pain.   Currently, she is getting good relief with tramadol, taking it 3 times a  day.  We will recommend she continued  to engage in her exercise program.  She does appear to be interested in doing some light weightlifting.  We  would  recommend a physical therapist to assist her in developing a home  program that is safe for her.  She would like to pursue this later this  fall.  We will see her back in September.           ______________________________  Brantley Stage, M.D.     DMK/MedQ  D:  11/10/2008 10:55:58  T:  11/10/2008 23:57:36  Job #:  [id number]   cc:   Barbette Hair. [place], DO  [address] [place], [place] [address]

## 2010-09-22 NOTE — Discharge Summary (Signed)
NAME:  Betty Stewart                         ACCOUNT NO.:  [id number]   MEDICAL RECORD NO.:  [id number]                   PATIENT TYPE:  INP   LOCATION:  0381                                 FACILITY:  [facility]   PHYSICIAN:  James L. Malon Kindle., M.D.          DATE OF BIRTH:  [birth year]   DATE OF ADMISSION:  02/21/2003  DATE OF DISCHARGE:  02/23/2003                                 DISCHARGE SUMMARY   ADMISSION DIAGNOSIS:  Possible Crohn's flare with impending small-bowel  obstruction.   DISCHARGE DIAGNOSIS:  Possible Crohn's flare with impending small-bowel  obstruction.   PROCEDURES:  None.   PERTINENT HISTORY:  The patient is a 55 year old African-American female who  has had Crohn's disease and has been somewhat noncompliant.  Has been  followed by [name] and was noncompliant with follow-up.  She has had a  colonoscopy with minimal colonic involvement and no ileitis.  There was a 15-  cm area of Crohn's in the terminal ileum.  She has been on Pentasa and  stopped that.  Has had prednisone and Entocort at home which she has been  managing on her own.  She presented with several days of abdominal pain and  not vomiting but was having bloating.  She had been taking prednisone and  Entocort on a p.r.n. basis.   PHYSICAL EXAM:  VITAL SIGNS:  On exam the patient was afebrile, vital signs  were normal other than a blood pressure elevated at 156/102.  HEENT:  Head exam was normal.  HEART, LUNGS:  Normal.  ABDOMEN:  Mildly distended, with mild tenderness, with decreased bowel  sounds.   Acute abdominal series showed possible impending obstruction.   For more details, please see dictated history and physical.   HOSPITAL COURSE:  The patient was admitted to a medical floor, placed on IV  antibiotics, IV Solu-Medrol, oral Entocort, and Pentasa.  Initially she was  only on ice chips.  The following day she felt that she was able to keep  things down, was doing much better.   Laboratories were remarkable for a left  shift but a normal white count with a hemoglobin of 15.9.  Urinalysis was  unremarkable other than slight proteinuria.  Amylase was slightly elevated,  which apparently occurs every time she has a flare of her Crohn's.  Triglycerides were normal at 64.  Sedimentation rate was slightly elevated  at 30.  The patient had repeat abdominal films which showed some still  dilated small bowel, but she was tolerating clear liquids and passing air,  and her pain was much improved.  It was felt that she could be discharged.   DISPOSITION:  The patient is discharged home.  Her diet will be full liquids  initially for two to three days with slow advancement to low-residue diet.  She will call and make arrangements to see [name] in the office in two  to three weeks.  MEDICATIONS:  1. Entocort EC 3 mg, 3 tablets, q.a.m.  2. Pentasa 1 g q.i.d.   She is instructed to call for any problems and will receive a low-residue  diet and dietary instructions prior to her discharge.                                               James L. Malon Kindle., M.D.    Waldron Session  D:  02/23/2003  T:  02/23/2003  Job:  [id number]   cc:   Bernette Redbird, M.D.  [address]  [place], [place] [address]  Fax: [phone]   Betti D. Pecola Leisure, M.D.  [address]  [place]  [place] [address]  Fax: [phone]

## 2010-09-22 NOTE — Op Note (Signed)
NAME:  Betty Stewart                         ACCOUNT NO.:  [id number]   MEDICAL RECORD NO.:  [id number]                   PATIENT TYPE:  INP   LOCATION:  0358                                 FACILITY:  [facility]   PHYSICIAN:  Angelia Mould. Derrell Lolling, M.D.             DATE OF BIRTH:  1955/09/26   DATE OF PROCEDURE:  11/24/2003  DATE OF DISCHARGE:                                 OPERATIVE REPORT   PREOPERATIVE DIAGNOSIS:  Crohn's ileitis.   POSTOPERATIVE DIAGNOSIS:  Crohn's ileitis.   OPERATION PERFORMED:  Resection of three feet of terminal ileum and cecum,  with primary anastomosis.   SURGEON:  Angelia Mould. Derrell Lolling, M.D.   FIRST ASSISTANT:  Vikki Ports, M.D.   OPERATIVE INDICATION:  This is a 55 year old black female with a history of  Crohn's ileitis, diagnosed in 39.  She has had about three or four  hospitalizations for partial small bowel obstruction, most recently about  three weeks ago.  She had to be readmitted recently with another episode of  abdominal pain and distention, and dilated small bowel.  She once again  resolved her symptoms.  A small bowel follow through done earlier this month  shows distortion of the terminal ileum, strictures, some dilatation and  probable fistulae.  A CT scan done last month shows a thickened terminal  ileum but no abscess.  Colonoscopy last month showed a normal colon and  edematous terminal ileum.  Upper endoscopy has been normal.   Her gastroenterologist felt that she is not going to respond any further to  anti-inflammatory agents and immunosupressives.  They asked for me to  consider elective resection of this diseased area.  I have discussed this  with the patient and it was her desire to go ahead and have this done at  this time.  She underwent a bowel prep over the past two days; she tolerated  that fairly well.   OPERATIVE FINDINGS:  The patient had significant dilatation and thickening  of the last three feet of small  bowel.  The last one foot had a very  thickened adherent loop, which may have formed fistulae between the two  loops, and may be the site of partial obstruction.  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  The rest of the small bowel  looked fine.  The rest of the colon felt normal.  I elected to resect the  last three feet of small intestine, all of which had significant fat  creeping and included the more proximal fistula.  Proximal to that point,  the small bowel looked fairly normal.  I also resected the cecum.   OPERATIVE TECHNIQUE:  Following the induction of general endotracheal  anesthesia, the patient's abdomen was prepped and draped in a sterile  fashion.  Lower midline incision was made  from well above the pubis to  around the left of the umbilicus and up above the umbilicus.  The fascia was  incised in the midline and the abdominal cavity entered under direct vision.  There were a few adhesions to the anterior abdominal wall, and these were  taken down.  There was no hernia in the incision or in the umbilicus.  We  examined the small bowel and found that the disease was localized to the  last three fee, with at least one and possibly two fistulae.  There was no  abscess or purulence.  I mobilized the right colon by dividing the lateral  peritoneal attachments, and mobilized the entire right colon over into the  wound.  I identified and preserved the right ureter and the right gonadal  vessels.   I transected the ileum, just proximal to the most proximal fistula point --  where the bowel looked fine.  This was done with a GIA stapling device.  I  mobilized and cleaned off the cecum and divided it using a GIA stapling  device -- about 1.5 inches above the ileocecal valve.  Mesenteric vessels  were isolated, clamped and divided; ligated then with 2-0 silk ties.  The  specimen  was then removed.   Anastomosis was created between the ileum and the right colon in a  functional end-to-end fashion, using a GIA stapling device.  The defect in  the bowel wall was closed with a TA-60 stapling device.   At this point, we changed our instruments and gloves.  A few other sutures  of  3-0 silk were placed to reinforce the staple line in certain areas.  The  mesentery was closed with interrupted sutures of 2-0 silk.  The abdomen and  pelvis was irrigated with about 2000 cc of saline.  We checked all the areas  of dissection and with particular attention to the right retroperitoneum and  the mesenteric repair.  There was no bleeding.   The anastomosis was inspected and was at least two fingerbreadths wide.  The  intestine and the omentum were returned to the anatomic positions.  The  midline fascia was closed with a running suture of #1 PDS, with about six or  seven interrupted sutures of #1 Novofil to reinforce the repair.  The wound  was irrigated with saline and skin closed with skin staples.  Clean bandages  were placed and the patient taken to the recovery room in stable condition.   ESTIMATED BLOOD LOSS:  Approximately 150 cc.   COMPLICATIONS:  None.   DISPOSITION:  Sponge, needle and instrument counts were correct.                                               Angelia Mould. Derrell Lolling, M.D.    HMI/MEDQ  D:  11/24/2003  T:  11/24/2003  Job:  [id number]   cc:   Jocelyn Lamer D. Pecola Leisure, M.D.  [address]  [place]  [place] [address]  Fax: [phone]   Iva Boop, M.D. [facility]

## 2010-09-22 NOTE — Assessment & Plan Note (Signed)
INTERVAL HISTORY:  [name] is a pleasant 55 year old black female who is  seen for the first time in our clinic on May 29, 2005.  She was last  seen June 29, 2005.  She is being seen in our pain clinic for chronic  low back pain.  She has a history of a grade 1-2 anterior listhesis of L4 on  L5 and degenerative disk disease at L4-5, L5-1 and facet arthropathy at  those levels as well.  She is back in today and reports the Neurontin was  not of much benefit even after we escalated the doses for her.  She finds  the Flexeril to be somewhat helpful, is taking it twice a day.  She is  taking the Ultram four times a day.  She reports fair relief with all these  medications at this point.  She is frustrated about the Neurontin.  She  knows it can possibly cause some weight gain with her and she is anxious to  get off of it.  She would like to try to lose weight.   Her average pain is about a 7 or an 8 on a scale of 10 localized to the left  low back area, radiates down the posterior thigh bilaterally, a little worse  on the right than on the left.  She states her low back/buttock-type pain is  about a 9 on a scale of 10 and it is worse with transitional movements going  from sit to stand.  The left leg pain is about a 7 or an 8 and that pain  really changes depending on the activity she is doing and the right leg is  the least painful for her at about a 6 on a scale of 10.  She gets fair  relief with the current meds that she is on.  She had been taking Neurontin  300 mg two p.o. q.i.d., Ultram 50 mg two p.o. q.i.d. as well and Flexeril  one p.o. b.i.d.   She is able to walk 10-15 minutes at a time.  She is able to climb stairs  and drive.  She is independent for the most part with her self-care.   Health and history form are reviewed.   No changes in past medical, social, or family history since our last visit.   EXAMINATION:  Her blood pressure is 108/58, pulse 96,  respirations 16, 100%  saturated on room air.  She is a mildly obese black female who appears her  stated age.  She is oriented x3.  Her affect is bright, alert, cooperative  and pleasant.  She does not have any difficulty transitioning from sit to  stand.  Her gait is nonantalgic.  She has normal gait mechanics, normal heel-  toe mechanics, base of support, balance is good, tandem gait is normal,  Romberg's test is negative.  Reflexes are symmetric and intact in the upper  and lower extremities.  Motor strength is good throughout upper and lower  extremities.  She has limitations in lumbar range of motion in all planes.   IMPRESSION:  1.  Degenerative lumbar disk disease, especially at L4-5, L5-S1.  2.  Grade 2 anterior listhesis L4-5.  3.  Facet arthropathy.  4.  Left intermittent sciatica.  5.  History of Crohn's disease.  6.  Hypertension.  7.  Left trochanteric bursitis/iliotibial band syndrome.   PLAN:  We discussed medications approximately 20 minutes today going over  her options.  At this point she  would like to wean off the Neurontin.  She  will be dropping one pill every 3 days until she is off of the Neurontin.  We will start her on Topamax next month at 25 mg one p.o. at bedtime and  then go to b.i.d. after that.  We will refill the following medications for  her:  Lidoderm 60 patches two refills, Ultram 50 mg two p.o. b.i.d., and  Flexeril 5 mg one  p.o. t.i.d., have increased this from b.i.d. to t.i.d. today and will give  her two refills on that.  We will see her back next month, anticipate  starting her on Topamax at that time.           ______________________________  Brantley Stage, M.D.     DMK/MedQ  D:  07/27/2005 14:02:12  T:  07/28/2005 13:32:59  Job #:  [id number]

## 2010-09-22 NOTE — H&P (Signed)
NAMEBurnis Stewart               ACCOUNT NO.:  [id number]   MEDICAL RECORD NO.:  [id number]          PATIENT TYPE:  INP   LOCATION:  1832                         FACILITY:  [facility]   PHYSICIAN:  Rosalyn Gess. Norins, M.D. [facility] OF BIRTH:  [birth year]   DATE OF ADMISSION:  05/19/2004  DATE OF DISCHARGE:                                HISTORY & PHYSICAL   CHIEF COMPLAINT:  Abdominal pain.   HISTORY OF PRESENT ILLNESS:  Betty Stewart is a 55 year old African-American  woman with a history of Crohn's disease.  She has had multiple abdominal  surgeries.  Most recently a small bowel resection in July of 2005 by Dr.  Danna Hefty secondary to her Crohn's ileitis verus stricture.  The  patient had been doing well in her usual state of health until September  when she had significant hip pain and back pain.  Her evaluations have  revealed that she has had bursitis and some spondylolisthesis and back pain.   The patient reports on May 19, 2004.  She developed epigastric  discomfort in the early part of the day which persisted throughout the day.  She had no nausea or vomiting or diarrhea.  Later in the evening, she  developed increasing upper abdominal discomfort accompanied by nausea and  vomiting.  Because of increased pain and emesis she came to the emergency  department.  Initial evaluation by the emergency room physician revealed  that the patient by CT scan had a small bowel obstruction.  She is now  admitted for control of nausea, pain, IV fluids, observation and surgical  consultation.   PAST MEDICAL HISTORY:  Surgical: 1. TAH with a unilateral SO.  2. Subsequent  contralateral SO.  3. Appendectomy.  4. C-section x2.  5. Cholecystectomy.  6. Small bowel resection.  Medical illnesses: 1. Crohn's disease.  2.  Steroid induced hyperglycemia.  3. Hypertension.  4. History of intermittent  recurrent pancreatitis.   ADMISSION MEDICATIONS:  Diovan HCT 160/25 once daily.  Tramadol 50  mg 1 to 2  tablets q.i.d. for pain.  Vicodin 5/325 q.6 p.r.n. pain.  Phenergan for  nausea.  Soma 350 mg q.i.d.  Aspirin.   FAMILY HISTORY:  Positive for stroke, diabetes mellitus, coronary artery  disease and hypertension.   SOCIAL HISTORY:  The patient had been employed at [employer] for the past 10  years.  She is currently in [employer].  She has  two children.  She is rarely using alcohol.  She is a nonsmoker.   REVIEW OF SYSTEMS:  The patient generally has been doing well.  She has had  no constitutional problems until this illness.  No respiratory,  cardiovascular, GYN problems.  ORTHOPEDIC: The patient is with chronic back  pain and leg pain.  She did have MRI of the hip in September which was  negative for avascular necrosis.   ADMISSION PHYSICAL EXAMINATION:  VITAL SIGNS: Temperature 98.8, blood  pressure 181/106, pulse 100, respirations 20, O2 saturation was 100%.  GENERAL APPEARANCE:  This is an overweight African-American female who is in  no acute distress.  HEENT  EXAM: Normocephalic, atraumatic, conjunctivae clear.  There are no  oral lesions.  NECK: Supple.  There was no thyromegaly.  NODES: No adenopathy was noted in the cervical, supraclavicular regions.  CHEST: No CVA tenderness.  LUNGS: Clear the no rales, wheezes or rhonchi.  BREAST EXAM: Deferred.  CARDIOVASCULAR: There is a 2+ radial pulse.  She had no JVD or carotid  bruit.  She had a quiet precordium with a regular rate and rhythm without  murmurs, rubs, or gallops.  ABDOMEN: Obese, soft.  She had scattered rare bowel sounds in the lower  abdominal regions.  She has well-healed infraumbilical surgical scars.  She  had no guarding or rebound.  She did have tenderness in the epigastrium and  tenderness in both lower quadrants.  RECTAL EXAM AND PELVIC EXAM: Deferred.  EXTREMITIES: Without clubbing, cyanosis, or edema or deformity.  NEUROLOGIC EXAM:  Nonfocal.   DATA BASE:  Hemoglobin 15.8, white  count 14,600 with 81% segs, 10% lymphs,  8% monos.  Chemistry: Sodium 139, potassium 3.8, chloride 103, CO2 21, BUN  11, creatinine 1.1, glucose 113.  LFTs were normal.  UA wt 3 to 6 WBCs per  high power field., rate bacteria.   CT scan of the abdomen revealed postsurgical changes.  There was an early,  partial small bowel obstruction.   ASSESSMENT AND PLAN:  1.  GI/GS.  Patient with history of Crohn's disease that has been in      remission since a resective surgery in July.  The patient has had other      surgeries in the abdomen.  Now with a small bowel obstruction most      likely secondary to adhesions.  Plan: The patient will be made n.p.o.      She will be supported with IV fluids.  She will be given IV medications      for nausea.  We will obtain a followup KUB.  I have asked for a surgical      consult given her past surgical history.  2. Orthopedic: Patient with      chronic back pain.  She is in no immediate distress.  Plan: Will treat      conservatively with IV Toradol for the first 24 hours.  3. Hypertension.      The patient was hypertensive at admission.  Because of her n.p.o. status      will treat with IV labetalol 10 mg IV q.1h. for systolic pressure      greater than 160, diastolic pressure greater than 100.       MEN/MEDQ  D:  05/20/2004  T:  05/20/2004  Job:  [id number]   cc:   Corinda Gubler, M.D.  [facility]   Vikki Ports, MD  [phone] [address]  [place]  [place] [address]  Fax: [phone]   Venita Lick. Russella Dar, M.D. [facility]

## 2010-09-22 NOTE — Assessment & Plan Note (Signed)
[name] is a pleasant 55 year old African American female who  was kindly referred back to our Pain and Rehabilitative Clinic.  She was  most recently seen in July 2010 prior to that it was in May 2007.   She is status post an L4-5 transforaminal lumbar interbody fusion with  PEEK cage and a posterior lateral arthrodesis at L4-5 from March 2008 by  Dr. Maeola Harman.   She is also known to have some mild disk degeneration and facet  degeneration at L5-S1 and L3-4 which is above and below her L4-5 fusion.  This was per MRI.  She is back in today requesting a refill of pain  medications.   She has been using Ultram, Flexeril, and Lidoderm, and finds that she  gets good relief with these medications.  She found an old prescription  of Topamax from 2007 which she tried taking again as well.  I have asked  her to stop taking any of her old medications today.   Her average pain is between 5 and 7 on a scale of 10.  Sleep is fair.  Pain is worse with walking, standing, improves with rest, gets good  relief with current meds.   Functional status.  She can walk 30 minutes at a time.  She can climbs  stairs and drive.  She is independent with self-care, indicates no  problems with respect to review of systems.   No changes in past medical, social, or family history.   EXAM:  Blood pressure 115/67, pulse is 95, respiration 18, 99% saturated  on room air.  She is mildly obese [place] American woman who does not  appear in any distress.  She is oriented x3.  Speech is clear.  Affect  is bright.  She is alert, cooperative, and pleasant.  Follows commands  without difficulty, answers my questions appropriately.   Cranial nerves and coordination are intact.  Reflexes are 2+ patellar  tendons symmetrically zero at the ankles bilaterally.  Sensation is  intact in the lower extremities.   Transitioning from sitting to standing is done with ease, gait is  normal.  Tandem gait, Romberg  test are all performed adequately.  Limitations are noted in lumbar range of motion especially with  extension.  She has a well-healed scar over the lumbar spine without  significant tenderness to palpation.  Lower extremity strength is 5/5  without focal deficit.   IMPRESSION:  1. Status post L4-5 fusion Dr. Venetia Maxon August 01, 2006.  2. Mild disk degeneration, mild facet arthrosis at L3-4 with mild disk      degeneration and facet arthrosis at L5-1.  3. History of right knee tricompartmental osteoarthritis.  4. Obesity.  5. Lumbago secondary to above.   PLAN:  We will refill her pain medications for her today.  I have asked  her to stop taking her Topamax.  She does not have much more than a  dozen or so pills left.  We will refill Ultram 50 mg two p.o. b.i.d.,  #120 with 3 refill.  She does not need any other medications today, and  she finds it is working quite well.  I encouraged her to continue to  stay as active as she is able to.  She has been doing some walking  regularly.  We will see her back in 4 months for  refill of medications at this time.  She takes her medications as  prescribed.  No aberrant behavior has been observed, and she  reports  overall good relief with the medicines.           ______________________________  Brantley Stage, M.D.     DMK/MedQ  D:  01/14/2009 12:46:28  T:  01/15/2009 07:01:57  Job #:  [id number]

## 2010-09-22 NOTE — Discharge Summary (Signed)
NAMEBurnis Stewart               ACCOUNT NO.:  [id number]   MEDICAL RECORD NO.:  [id number]          PATIENT TYPE:  INP   LOCATION:  5742                         FACILITY:  [facility]   PHYSICIAN:  Thornton Park. Daphine Deutscher, MD  DATE OF BIRTH:  [birth year]   DATE OF ADMISSION:  05/19/2004  DATE OF DISCHARGE:  05/31/2004                                 DISCHARGE SUMMARY   DISCHARGE DIAGNOSES:  1.  Small bowel obstruction, status post exploratory laparotomy with lysis      of adhesions.  2.  Crohn's disease, status post small bowel resection in July 2005.  3.  Hypertension.  4.  History of recurrent pancreatitis.  5.  Status post cholecystectomy.  6.  Status post appendectomy.  7.  Status post cesarean section x 2.  8.  Status post total abdominal hysterectomy with unilateral salpingo-      oophorectomy.   HOSPITAL COURSE:  [name] is a 55 year old female with multiple  abdominal surgeries.  On the day prior to admission, she developed  epigastric discomfort which persisted throughout the day.  She had no  associated nausea, vomiting, or diarrhea.  She had increasing upper  abdominal discomfort that eventually developed into nausea and vomiting.  Because of the pain, she came to the emergency room and was diagnosed with a  small bowel obstruction.  Initially, she was treated with IV fluids,  observation, and pain control.   Ultimately, medical management did not reduce the small bowel obstruction,  and she was taken to the operating room on May 26, 2004.  She underwent  an exploratory laparotomy with lysis of adhesions.  Over the next several  days, she gradually began taking oral food, increased her ambulation, and by  May 31, 2004 she was ready for discharge to home.   Her lab studies during her hospitalization included hemoglobin 12.9,  hematocrit 37.3, potassium 4, BUN 6, creatinine 0.9.  Her LFTs are normal.   By May 31, 2004, the patient was ready for discharge to home  on the  current medications:  Metoprolol 12.5 mg b.i.d.; Diovan as prior to  admission; Phenergan 25 mg 1 tablet q.6 h. as needed for nausea; Demerol 50  mg 1 tablet  q.4 h. as needed for severe pain.  No straining or lifting over 10 pounds  until seen in the office.  Remain on a lowfat diet.  Clean over area with  soap and water.  No scrubbing.  Call for any questions or concerns.  She is  to call for an appointment to see Dr. Luan Pulling in two weeks at [phone].      LB/MEDQ  D:  05/31/2004  T:  05/31/2004  Job:  [id number]   cc:   Vikki Ports, MD  [phone], [address]  [place]  [place] [address]  Fax: [phone]   Rosalyn Gess. Norins, M.D. [facility]

## 2010-09-22 NOTE — Consult Note (Signed)
NAMEBurnis Stewart               ACCOUNT NO.:  [id number]   MEDICAL RECORD NO.:  [id number]          PATIENT TYPE:  INP   LOCATION:  5731                         FACILITY:  [facility]   PHYSICIAN:  Cherylynn Ridges III, M.D.DATE OF BIRTH:  June 02, 1955   DATE OF CONSULTATION:  DATE OF DISCHARGE:                                   CONSULTATION   CONSULTING PHYSICIAN:  Cherylynn Ridges, M.D.   Dear Dr. Debby Bud,   Thank you for asking me to see [name].  A 55 year old female who had  previously had a bowel resection by Dr. Derrell Lolling for a Crohn's ileitis and  obstruction.  She has a three-day history of worsening abdominal discomfort  that currently is associated with nausea and vomiting.  No fevers or chills.  There is a question whether or not this is a recurrence of her Crohn disease  or simply adhesive bowel obstruction.  On my exam, the patient is not significantly tender.  She does have some  mild discomfort on the right side but no rebound, no guarding.  She does  have some bowel sounds.  X-ray pattern is indicative of a possible early partial small-bowel  obstruction.  I recommended an NG tube to the patient which she refuses.  I told her this  is the best way of possibly keeping her from having surgery, however, she  still refused; therefore, I think she should be admitted for IV hydration  and an NG tube if she should consent and otherwise we will follow her  clinically.   Thank you,      Jame   JOW/MEDQ  D:  05/20/2004  T:  05/21/2004  Job:  [id number]   cc:   Rosalyn Gess. Norins, M.D. [facility]

## 2010-09-22 NOTE — H&P (Signed)
NAME:  Betty Stewart                         ACCOUNT NO.:  [id number]   MEDICAL RECORD NO.:  [id number]                   PATIENT TYPE:  INP   LOCATION:  5735                                 FACILITY:  [facility]   PHYSICIAN:  Betty Stewart, M.D. [facility]          DATE OF BIRTH:  Jan 05, 1956   DATE OF ADMISSION:  03/29/2003  DATE OF DISCHARGE:                                HISTORY & PHYSICAL   ADMITTING PHYSICIAN:  Betty Stewart, M.D., covering for GI unassigned  patients, previously a patient of Dr. Bernette Stewart, M.D.   CHIEF COMPLAINT:  Nausea, vomiting, and upper abdominal pain.   HISTORY OF PRESENT ILLNESS:  This is a 55 year old African-American female  with diagnosis of Crohn's ileitis.  She was originally diagnosed in 1997.  She moved to [place] about two years ago. Up until very recently, she was  under the care of Dr. Matthias Stewart.  She has history of partial small-bowel  obstructions and was hospitalized in April 2003 and again in October 2004  for obstructions which resolved with medical therapy.  She says that she has  never required placement of an NG tube to decompress her bowel.  That  hospitalization for recent bowel obstruction was October 17 through October  19.  She has had trouble complying with medical regimens because of lack of  insurance and financial issues, but at discharge last time, she was put on  Entocort 9 mg daily and Pentasa 1 mg 4 times daily which she has been using  since her discharge.  She was to have followed up in Dr. Donavan Stewart office in  a couple of weeks per the discharge instructions.  However, the patient says  that when she contacted Dr. Donavan Stewart office, she was advised because of  nonpayment of medical bills, she was being discharged from his practice.  She has not received a formal letter of discharge, however.   The patient has episodic bouts of upper abdominal pain with nausea,  sometimes vomiting.  If the pain lasts only for several  hours and is  moderate in severity, she does not necessarily seek out medical attention.  However, today she developed severe epigastric pain  with protracted nausea  and vomiting.  She had bowel movement on the morning of the day of  admission.  She does say that she does not have any diarrhea, that she tends  to have constipation.  Emesis has been greenish-dark in color.  She  presented to the emergency room where she was evaluated by Dr. Russella Stewart, who  was covering GI unassigned call.  An acute abdominal series showed partial  small-bowel obstruction, no free air.  White blood cell count was elevated  at 18.4.  Lipase was also elevated.  Dr. Russella Stewart admitted her for management  of recurrent partial small-bowel obstruction.   Prior GI studies include a colonoscopy in 06-15-2003by Dr. Matthias Stewart.  There  was no colitis.  He did get into the terminal ileum, and she had minimal  residual ileitis, having been treated with Entocort fro several weeks prior  to the exam.  He also noted internal hemorrhoids.  The patient had a small-  bowel follow-through in June 2003 showing a tight 50 cm length stricture in  the terminal ileum.   ALLERGIES:  No known allergies.   CURRENT MEDICATIONS:  1. Entocort 9 mg p.o. daily.  2. Pentasa 1 g p.o. 4 times daily.  3. Clonidine 3 mg once daily p.r.n.  4. Aspirin 325 mg daily.  5. Supplements include vitamin B, betacarotene, black cohosh root, chromium     picolinate, vitamin D, vitamin A and D, vitamin B12, vitamin C, zinc,     vitamin A.   PAST MEDICAL HISTORY:  1. Crohn's ileitis.  2. History of partial small-bowel obstructions.  3. Status post total abdominal hysterectomy and bilateral salpingo-     oophorectomy.  4. Status post appendectomy.  5. Status post Cesarean section twice.  6. Status post cholecystectomy.  7. Remote history of hypertension as a teenager but none recently.   SOCIAL HISTORY:  The patient rarely drinks alcohol.  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  She works at the [employer].  She lives with her  daughter in [place].  She used to smoke but has not smoked in three to  four years.  She has two adult children.   FAMILY HISTORY:  Significant for stroke in her father, diabetes, coronary  artery disease, and hypertension.  No history of GI diseases, no history of  cancers.   REVIEW OF SYSTEMS:  NEUROLOGIC:  No headaches, no focal weakness, no  paresthesias, no visual problems.  PULMONARY:  No shortness of breath, no  cough, no history of pneumonia.  CARDIOVASCULAR:  No chest pain, no  palpitations, no extremity edema.  GU:  No incontinence, hematuria, urgency,  or frequency.  MUSCULOSKELETAL:  Denies back problems or joint disease.  GYN:  After her hysterectomy 12 or so years ago, she did take hormone  replacement therapy, but this was discontinued a year or so ago.  Even while  on hormones, she did have hot flashes, and she is still having hot flashes.  Clonidine had been started as a measure to try to control the hot flashes  but has not help.  She does state she took some of other Clonidine yesterday  because it makes her sleepy, and she thought it would help her feel better  from her GI symptoms.  DERMATOLOGIC:  No nonhealing ulcers.  She is itching  a little bit on her upper extremities but has no rash.  HEMATOLOGIC:  No  nose or oral bleeding, no unusual clotting problems.  All other Review of  Systems were negative.   PHYSICAL EXAMINATION:  VITAL SIGNS:  Blood pressure 129/90, pulse 80,  respirations 22, temperature 98.8.  GENERAL:  The patient is uncomfortable appearing but non-toxic looking  African-American female.  She is somnolent following administration of  narcotics in the emergency room.  She is able to provide a good history. HEENT:  Sclerae nonicteric without pallor.  Extraocular movements are  intact.   Oropharynx is moist and clear.  No oral lesions or bleeding seen.  NECK:  No masses, thyromegaly, or JVD.  CHEST:  Clear to auscultation and percussion bilaterally with no respiratory  distress.  ABDOMEN:  Soft, mildly  distended, with bowel sounds present.  No tinkling or  tympanitic bowel sounds.  She has tenderness in the upper abdomen but no  guarding or rebound.  No masses, hepatosplenomegaly, or hernias appreciated.  RECTAL:  Heme negative with no palpable lesions per the emergency room  physician.  It was not repeated.  GYN/BREASTS:  Exams not performed.  NEUROLOGIC:  Grip and pedal strength 5/5.  She is alert and oriented x 3.  She is moving all fours and able to stand without assistance.  EXTREMITIES:  No cyanosis, clubbing, or edema.  DERMATOLOGIC:  No rashes or lesions.  PSYCHIATRIC:  Affect is a bit flat but hard to make and assessment of  depression given that she just had administration of narcotics.   LABORATORY DATA:  Urinalysis: Trace leukocyte esterase, no nitrites and 15  mg ketones.  Urine pregnancy is negative.  White blood cell count 18,400,  hemoglobin 15.1, hematocrit 43.5, MCV 89.2, platelets 320,000.  Sodium 139,  potassium 4.0, chloride 102, carbon dioxide 26, glucose 113, BUN 11,  creatinine 1.0.  Albumin 4.0, total bilirubin 0.4, alkaline phosphatase 140,  AST 26, ALT 19, lipase 22, and amylase 259.  Toxicology screen negative for  everything including opiates, cocaine, benzodiazepines, barbiturates,  amphetamines, and THC.   Acute abdominal series shows no chest or cardiac abnormalities.  There are  some dilated air/fluid loops of small bowel consistent with partial small-  bowel obstruction.  There is some air and stool in the colon.   IMPRESSION:  1. Partial small-bowel obstruction associated with Crohn's ileitis and known     narrowing in the terminal ileum from prior small-bowel follow-through.     Possibly bowel obstruction could be coming from  adhesions, possibly from     stricturing in the ileum.  2. History of Crohn's ileitis with checkered history of medical compliance,     but by patient report, currently compliant with medications for the past     three to four weeks.  3. Mildly increased alkaline phosphatase.  Rule out gastrointestinal versus     bony source.   PLAN:  Patient admitted to Dr. Derl Barrow service.  The patient may  require NG tube placement.  She is currently refusing this;  however, if  pain and nausea continue, she may need this.  She may also require surgical  consultation.  For now, patient to be supported with IV fluids and p.r.n.  pain and nausea medications.  Will begin IV Solu-Medrol, IV Protonix, and  keep her n.p.o.      Jennye [place], P.A. [facility]                   Betty Stewart, M.D. [facility]    SG/MEDQ  D:  03/30/2003  T:  03/30/2003  Job:  [id number]

## 2010-09-22 NOTE — Op Note (Signed)
NAMEBurnis Stewart               ACCOUNT NO.:  [id number]   MEDICAL RECORD NO.:  [id number]          PATIENT TYPE:  INP   LOCATION:  5742                         FACILITY:  [facility]   PHYSICIAN:  Vikki Ports, MDDATE OF BIRTH:  22-Oct-1955   DATE OF PROCEDURE:  05/26/2004  DATE OF DISCHARGE:                                 OPERATIVE REPORT   PREOPERATIVE DIAGNOSIS:  Small bowel obstruction.   POSTOPERATIVE DIAGNOSIS:  Small bowel obstruction.   PROCEDURE:  Exploratory laparotomy and lysis of adhesions.   SURGEON:  Vikki Ports, MD   ASSISTANT:  Adolph Pollack, M.D.   FINDINGS:  Adhesions in the right lower quadrant, with obvious transition  point.   ANESTHESIA:  General.   DESCRIPTION OF PROCEDURE:  The patient was taken to the operating room and  placed in the supine position.  After adequate general anesthesia was  induced using endotracheal tube, the abdomen was prepped and draped in  normal sterile fashion.  Using a vertical midline incision, I dissected down  to the fascia.  Fascia was opened vertically.  Previous omental and small  bowel adhesions were taken down from the anterior abdominal wall using sharp  dissection.  On running the bowel from the ligament of Treitz, the  transition zone was seen near the terminal ileum.  This was secondary to an  adhesion to the right pelvic sidewall.  This was lysed, and all contents  from the ileocolostomy were milked backward towards the ligament of Treitz.  We returned about 2800 cc from the OG tube.  Bowel was then flat.  Omentum  was replaced anteriorly.   There was a small serosal tear in the terminal ileum, which was repaired  with interrupted 3-0 silks.  The fascia was closed with running #1 Novofil.  Skin was closed with staples.  The patient tolerated the procedure well and  went to PACU in good condition.      KRH/MEDQ  D:  05/26/2004  T:  05/27/2004  Job:  [id number]

## 2010-09-22 NOTE — Op Note (Signed)
Betty Stewart, Betty Stewart              ACCOUNT NO.:  [id number]   MEDICAL RECORD NO.:  [id number]          PATIENT TYPE:  INP   LOCATION:  3012                         FACILITY:  [facility]   PHYSICIAN:  Danae Orleans. Venetia Maxon, M.D.  DATE OF BIRTH:  1956/04/25   DATE OF PROCEDURE:  08/01/2006  DATE OF DISCHARGE:                               OPERATIVE REPORT   PREOPERATIVE DIAGNOSIS:  Grade 2 spondylolisthesis L4-L5 with  spondylosis, stenosis, degenerative disc disease and radiculopathy.   POSTOPERATIVE DIAGNOSIS:  Grade 2 spondylolisthesis L4-L5 with  spondylosis, stenosis, degenerative disc disease and radiculopathy.   PROCEDURE:  1. L4 Gill procedure.  2. L4-L5 transforaminal lumbar interbody fusion with interbody PEEK      cage and morcellized bone autograft.  3. L4-L5 pedicle screw fixation.  4. Posterolateral arthrodesis L4-L5 level.   SURGEON:  Danae Orleans. Venetia Maxon, M.D.   ASSISTANT:  Payton Doughty, M.D.  Georgiann Cocker, RN   ANESTHESIA:  General endotracheal anesthesia.   BLOOD LOSS:  450 mL.   COMPLICATIONS:  Durotomy repaired primarily.   INDICATIONS:  Betty Stewart is a 55 year old woman with grade 2  spondylolisthesis of L4-L5 with marked stenosis and severe low back and  bilateral lower extremity pain.  It was elected to take her to surgery  for decompression and fusion at this level.   PROCEDURE:  Betty Stewart was brought to the operating room.  Following  satisfactory uncomplicated induction of general endotracheal anesthesia  and placement of intravenous lines and Foley catheter, the patient was  placed in the prone position on the operating table.  Her low back was  prepped and draped in the usual sterile fashion.  The area of planned  incision was infiltrated with 0.25% Marcaine and 0.5% lidocaine with  1:100,000 epinephrine.  An incision was made in the midline, carried  through copious adipose tissue to the lumbodorsal fascia which was  incised bilaterally.  Subperiosteal  dissection was performed exposing  the L4-L5 level.  A self-retaining retractor was placed and marker  probes were placed at the L4-L5 transverse processes and this was  confirmed on intraoperative x-ray.   A Gill procedure of L4 was performed and the inferior facets of L4 were  removed along with the lamina and spinous process of L4 and the superior  aspect of the L5 spinous process. The ligamentous tissue was extremely  adherent to the dura and, on removing adherent ligamentous tissue on the  left side of midline, a durotomy was created and this was repaired  primarily using the operating microscope with 6-0 Prolene stitches in a  running fashion with complete cessation of spinal fluid leak.  This was  confirmed on Valsalva maneuver.  Subsequently, the remaining  decompression was performed with decompression of both L4 nerve roots  and the lateral aspect of the thecal sac.  Ligamentous tissue was  removed over the remaining dura without any difficulty.   From the right side of the midline, a TLIF interbody fusion was  performed initially with a discectomy on the right and this was carried  across the midline with a variety of  endplate preparation curets and  ring curets and the endplates were stripped down to bleeding bone.  The  laminar spreader was placed between the spinous processes to open the  laminar intralaminar space and after preparing the endplates and  trialing, it was elected to use a 12-mm PEEK banana shaped cage which  was packed with morcellized bone autograft and Osteocel.  Additional  bone graft was placed within the interspace, both medially and laterally  and inferiorly. Subsequently, the cage was placed and tamped into  position and countersunk appropriately.  An additional bone graft was  placed overlying the cage.  The position of the implant was confirmed on  intraoperative x-ray, both in AP and lateral planes.  Subsequently,  pedicle screws were placed at L4  (40 x 6.5 mm screws at L4 and 45 x 6.5  mm screws at L5).  All screws had excellent purchase.  The remaining  bone graft was placed in the posterolateral region to the right of  midline, tamped in position, rods were placed and locked in situ.  Final  Valsalva maneuver demonstrated no evidence of CSF leak.   The self-retaining retractor was removed.  The lumbodorsal fascia was  closed with #1 Vicryl sutures, subcutaneous tissues were approximated  with 2-0 Vicryl interrupted inverted sutures, and the skin edges were  approximated with running 3-0 nylon stitch.  The wound was dressed with  bacitracin, Telfa gauze, and tape.  The patient was extubated in the  operating room and taken to the recovery room in stable, satisfactory  condition having tolerated the operation well.  Counts were correct at  the end of the case.      Danae Orleans. Venetia Maxon, M.D.  Electronically Signed     JDS/MEDQ  D:  08/01/2006  T:  08/01/2006  Job:  [id number]

## 2010-09-22 NOTE — Consult Note (Signed)
NAME:  Betty Stewart                         ACCOUNT NO.:  [id number]   MEDICAL RECORD NO.:  [id number]                   PATIENT TYPE:  INP   LOCATION:  0106                                 FACILITY:  [facility]   PHYSICIAN:  Angelia Mould. Derrell Lolling, M.D.             DATE OF BIRTH:  24-May-1955   DATE OF CONSULTATION:  11/18/2003  DATE OF DISCHARGE:                                   CONSULTATION   REASON FOR CONSULTATION:  Evaluate Crohn's disease.   HISTORY OF PRESENT ILLNESS:  This is a 55 year old black female who has a  history of Crohn's ileitis, [place] out of town in 1997.  She has had at  least 304 hospitalizations for partial small bowel obstruction, most  recently about 3 weeks ago.  Each time she is admitted, her symptoms resolve  without nasogastric suction, and with treatment with steroids, bowel rest,  and antibiotics.   Recent work-up included a small bowel follow through on November 10, 2003, which  shows distortion of the distal ileum, strictures, alternatively dilatation,  and probable fistulae.  A CT scan on October 25, 2003 shows a thickened  terminal ileum, but no abscess.  Colonoscopy on October 29, 2003 shows a normal  colon and edematous terminal ileum.  Upper endoscopy on October 29, 2003 was  normal.   She now presents with a 24-48 hour history of once again worsening nausea  and right-sided crampy abdominal pain.  She has not vomited at this time, as  she usually does in the past.  Her bowel movements are less in volume, but  she is having daily bowel movements.  She denies fever or chills.   Dr. Stan Head is admitting her for treatment, and has asked me to see her  to consider whether or not elective surgical resection would be indicated at  this point.  He feels that she is now refractory to medical therapy.   PAST MEDICAL HISTORY:  1. Total abdominal hysterectomy and bilateral salpingo-oophorectomy.  2. Appendectomy.  3. Laparoscopic cholecystectomy.  4. Cesarean  section x2.  5. Borderline hypertension.  6. Recurrent small bowel obstruction secondary to Crohn's ileitis.   CURRENT MEDICATIONS:  1. Prednisone 20 mg daily.  2. Pentasa 1000 mg t.i.d.  3. Darvocet.  4. Multivitamins.  5. Aspirin.   DRUG ALLERGIES:  1. DILAUDID.  2. DEMEROL.   FAMILY HISTORY:  Mother died of coronary artery disease.  Father died; has a  stroke and diabetes.   SOCIAL HISTORY:  The patient lives in [place].  She is separated.  She  lives with one of her daughters.  She has 2 children.  Denies the use of  tobacco.  Drinks alcohol occasionally.  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   REVIEW OF SYSTEMS:  All systems were reviewed.  They are noncontributory,  except as described above.   PHYSICAL EXAMINATION:  GENERAL:  Relatively pleasant middle-aged black  female in moderate distress.  She is ambulating around the ER to try to  relieve her discomfort.  She is cooperative.  VITAL SIGNS:  Temperature 98.5, blood pressure 153/101, heart rate 124 and  regular, respiratory rate 20.  HEENT:  Eyes - sclerae are clear.  Extraocular movements are intact.  NECK:  Supple, nontender.  No adenopathy.  No jugular venous distention.  LUNGS:  Clear to auscultation.  No wheezing.  No chest wall tenderness.  HEART:  Regular tachycardia.  No murmur.  BREASTS:  Not examined.  ABDOMEN:  Slightly obese, soft, nondistended.  Bowel sounds are normal  without any rushes or tinkles.  She is tender in the right lower quadrant,  but I do not palpate a mass, and she really does not have any significant  rebound.  She has well-healed laparoscopic scars in the upper abdomen, and  well-healed lower midline scar from her cesarean section.  I do not feel a  hernia.  EXTREMITIES:  She moves all 4 extremities well without pain or deformity.  NEUROLOGIC:  No gross motor or sensory deficits.    ADMISSION DATA:  She had a KUB of the abdomen which shows a single dilated  loop of small bowel.  Her laboratory work shows a urinalysis which is very  concentrated with a specific gravity of greater than 1.040.  Amylase is 225.  Lipase is only 25.  Comprehensive metabolic panel shows basically normal  findings.  CBC shows a white count of 11,600, and hemoglobin of 16.4.   ASSESSMENT:  1. Long-standing Crohn's ileitis.  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  2. I suspect that she would most likely benefit from elective resection of     her disease, and she seems motivated to consider this.   PLAN:  1. I agree with Dr. Marvell Fuller plan for IV hydration, bowel rest, IV     steroids, and intravenous Flagyl and Cipro.  2. We will plan to get a CT scan of the abdomen tomorrow to make sure we do     not have a more serious complication.  3. We will discuss further with you and with the patient regarding elective     resection.                                               Angelia Mould. Derrell Lolling, M.D.    HMI/MEDQ  D:  11/18/2003  T:  11/19/2003  Job:  [id number]   cc:   Iva Boop, M.D. [facility]   Betti D. Pecola Leisure, M.D.  [address]  [place]  [place] [address]  Fax: [phone]

## 2010-09-22 NOTE — Discharge Summary (Signed)
Betty Stewart, Betty Stewart              ACCOUNT NO.:  [id number]   MEDICAL RECORD NO.:  [id number]          PATIENT TYPE:  INP   LOCATION:  3012                         FACILITY:  [facility]   PHYSICIAN:  Danae Orleans. Venetia Maxon, M.D.  DATE OF BIRTH:  April 01, 1956   DATE OF ADMISSION:  08/01/2006  DATE OF DISCHARGE:  08/05/2006                               DISCHARGE SUMMARY   REASON FOR ADMISSION:  1. Lumbar spondylolisthesis.  2. Lumbosacral spondylosis.  3. Lumbar disk herniation and degeneration.  4. Hypotension.  5. Tachycardia.  6. Hypertension.  7. Lumbar disk displacement, arthropathy, not otherwise specified      pelvis with accidental laceration of dura.   FINAL DIAGNOSES:  1. Lumbar spondylolisthesis.  2. Lumbosacral spondylosis.  3. Lumbar disk herniation and degeneration.  4. Hypotension.  5. Tachycardia.  6. Hypertension.  7. Lumbar disk displacement, arthropathy, not otherwise specified      pelvis with accidental laceration of dura.   HISTORY OF ILLNESS/HOSPITAL COURSE:  Betty Stewart is a 55 year old  woman with grade 1 spondylolisthesis and spondylosis and stenosis at L4-  5 level.  She underwent L4 Gill procedure and L4-5 TLIF fusion with  pedicle screw fixation.  She had a durotomy repair primarily at the time  of surgery, was kept flat and gradually mobilized.  Was doing well on  03/31 and discharged home with instructions to follow up in the office  in two weeks.   DISCHARGE MEDICATIONS:  Discharged home on Percocet and Valium.      Danae Orleans. Venetia Maxon, M.D.  Electronically Signed     JDS/MEDQ  D:  09/05/2006  T:  09/06/2006  Job:  [id number]

## 2010-09-22 NOTE — Assessment & Plan Note (Signed)
Betty Stewart is a 55 year old black female whose being seen in our [facility] for low back and posterior leg pain.   She has a history of degenerative lumbar disk disease. She has a grade 2  anterior listhesis at L4-5, facet arthropathy, intermittent sciatic  symptoms.   Her case is also complicated by a history of Crohn's disease, hypertension  and she has also been diagnosed with trochanteric bursitis on the left.   She is back in today. Over the last couple of months, we have trialed her on  Neurontin. She states she did not like being on Neurontin as it made her too  sedated and we have tapered her back off of Neurontin and last month trialed  her on Topamax. She is currently taking 25 mg each day and has noted no  adverse side effects from this medication. She is not sure if it is helping  much. She suspects it might be helping a bit and is interested in increasing  the dose somewhat today.   The continues to take Ultram 50 mg 2 tablets twice a day. Also taking  Flexeril and finds this drug to be particularly helpful. She was given a  prescription for Lidoderm however found it to be cost prohibitive for use  for her.   Average pain is 7-8 on a scale of 10 predominantly located in the low back  radiating down to predominantly the left leg. Also had some tenderness along  the left lateral hip area especially with pressure on this area.   The patient remains quite active. She is taking 12 credit hours and  currently has a test coming up. She is independent with her self care for  the most care, needs some assistance with meal prep, household duties and  shopping. Walks between 10 and 15 minutes at a time.   Again states that she is anxious to get back to some sort of exercise  program to help with her weight. She finds stairs to be somewhat painful but  is able to climb them. She currently drives as well. No new changes in past  medical, social or family  history since our last visit.   PHYSICAL EXAMINATION:  Blood pressure 118/82, pulse 111, respirations 16,  98% saturated on room air.   She is a mildly obese black female who appears her stated age. She appears  well-developed as well as well-nourished.   She is oriented x3, her affect is bright, alert, she is cooperative,  pleasant.   She transitions from sitting to standing without much difficulty. She does  appear to have a bit of an antalgic gait with the initial steps in the room  with decreased weightbearing in the left lower extremity. Her balance is  quite good, she has difficulty with tandem gait secondary to some lateral  left hip pain during this maneuver. Romberg test is negative. Seated  reflexes are diminished at the patellar tendon and Achilles tendon. No  abnormal tone is noted. Motor strength is good throughout in both lower  extremities, slightly decreased right EHL compared to left; however, does  report some increased pain in the first MTP joint bilaterally when this is  tested however.   Straight leg raise is negative.   Quite a bit of tenderness again with palpation over the left lesser  trochanter.   IMPRESSION:  1.  Degenerative lumbar disk disease especially L4-5, L5-S1.  2.  Grade 2 anterior listhesis L4 and 5.  3.  Facet arthropathy.  4.  Intermittent left sciatica.  5.  History of Crohn's disease.  6.  Hypertension.  7.  Lesser trochanteric bursitis/iliotibial band syndrome.   PLAN:  Will increase her Topamax from 25 mg daily up to 25 mg b.i.d. for a  week and then 25 mg t.i.d. She currently has a refill for her Flexeril. Will  give her a prescription for Ultram again today, 50 mg 2 p.o. b.i.d. #120.   She continues to use these medications appropriately, no aberrant behavior  has been noted. She reports between fair and good relief with the meds that  she is on, she remains quite functional with them. Will see back in a month.  She will let us  know if she has any problems with the Topamax. Anticipate  increasing it up to 50 mg b.i.d. at the next visit. She is also interested  in possibly considering a trochanteric bursitis injection followed by  physical therapy. She has also expressed an interest in epidural steroid  injection. We will address her back and her hip over the next couple of  months __________. First we will see how she does on the medication for  neuropathic pain.           ______________________________  Brantley Stage, M.D.     DMK/MedQ  D:  08/24/2005 09:59:04  T:  08/25/2005 09:57:07  Job #:  [id number]   cc:   Thomos Lemons, D.O. [facility]  [address] [place], [place] [address]

## 2010-09-22 NOTE — Discharge Summary (Signed)
Betty Stewart. [facility]  Patient:    Betty Stewart Visit Number: [id number] MRN: [id number]          Service Type: MED Location: [id number] Attending Physician:  Dennison Bulla Ii Dictated by:   Florencia Reasons, M.D. Admit Date:  08/20/2001 Discharge Date: 08/25/2001                             Discharge Summary  DISCHARGE DIAGNOSES: 1. Crohns ileitis with exacerbation. 2. Hyperamylasemia and hyperlipasemia. 3. Status post multiple operations including total abdominal hysterectomy with    bilateral salpingo-oophorectomy, cholecystectomy and appendectomy.  CONSULTATIONS:  None.  COMPLICATIONS:  None.  PROCEDURE:  Computed tomography of abdomen and pelvis.  LABORATORY DATA AND X-RAY FINDINGS:  Admission white count 11,600, on repeat 6300, admission hemoglobin 16.2, on repeat 14.5, MCV 89, RDW 12.4, platelets 223,000, 83 polys, 8 lymphs and 6 monos.  Admission chemistry panel basically within normal limits except sodium 133 and Alk phos 130.  Other liver chemistries normal.  Albumin 3.6.  Albumin after hydration and fasting fell to 3.2.  Admission amylase was 246 with lipase of 21, the latter of which is in the normal range.  However, these values rose over the next several days to 265 and 63 respectively.  The following day, prior to discharge, they were 603 and 206 respectively.  Urinalysis was basically clear with a specific gravity on admission of 1.022.  CT scan and plain abdominal series were felt to be consistent with a distal small bowel obstruction without free air abscess or any abnormalities of the pancreas.  HOSPITAL COURSE:  [name] is a 55 year old, African-American female who was due to see my partner as a new patient in the office to establish local GI care approximately two weeks from now, but before she could have this appointment with Dr. Randa Evens, asymptomatic exacerbation forced her to the hospital where she was  admitted by her recovering partner, Dr. Reece Agar. She was admitted to the floor and given IV steroids, bowel rest and IV fluids and had a rather prompt turn around of symptoms.  We were able to advance her diet, reduce her pain medications and by the day of discharge, she was on oral steroids and tolerating a solid diet with minimal if any abdominal pain.  The only other issue during the patients hospitalization was hyperamylasemia which became progressive during the hospitalization and hyperlipasemia which developed during the course of the hospitalization.  As noted, the patients admission amylase was elevated even before steroids were initiated and her admission CT scan showed a normal pancreas.  More over, the patient did not develop symptoms of clinical pancreatitis such as significant upper abdominal pain or protracted vomiting, although some mild epigastric pain and mild nausea did occur a day or two prior to discharge.  Since the patient was able to have her diet advanced and was tolerating the steroids otherwise well, continued therapy in that matter was felt to be appropriate.  It should be noted that elevation of these pancreatic enzymes had been noted on previous hospitalizations in the [place] part of the state where she previously resided.  It is noteworthy also that the patient had been off medications for her Crohns disease and she had been without medical care at least for the past six months or so since she moved to this area from the [place] part of the state.  The risks of steroids  were reviewed with the patient including the possibility of aseptic necrosis of the hip.  Discharge was felt to be clinically appropriate and in fact, the patient felt very much ready for discharge by the day of discharge.  DISPOSITION:  Discharged to home.  DIET:  Low fat, low residue diet.  ACTIVITY:  May engage in normal activities.  May remain off work for the next week or so  while she gathers her strength.  SPECIAL INSTRUCTIONS:  She is to call us day or night in the event of any significant symptomatic recurrence or other problems.  FOLLOWUP:  She will follow up with [name] on April 29, at 3 p.m.  DISCHARGE MEDICATIONS: 1. Prednisone in a tapering dose (30 mg, 20 mg, 15 mg, 10 mg and then 5 mg    daily until I see her back in the office in about a week from now). 2. Entocort 9 mg each morning. 3. Dicyclomine on a p.r.n. basis which she had been using prior to admission. 4. Estrace.  CONDITION ON DISCHARGE:  Improved. Dictated by:   Florencia Reasons, M.D. Attending Physician:  Dennison Bulla Ii DD:  08/25/01 TD:  08/26/01 Job: [id number]

## 2010-09-22 NOTE — Discharge Summary (Signed)
NAME:  Betty Stewart                         ACCOUNT NO.:  [id number]   MEDICAL RECORD NO.:  [id number]                   PATIENT TYPE:  INP   LOCATION:  0358                                 FACILITY:  [facility]   PHYSICIAN:  Angelia Mould. Derrell Lolling, M.D.             DATE OF BIRTH:  12/09/1955   DATE OF ADMISSION:  11/18/2003  DATE OF DISCHARGE:  11/29/2003                                 DISCHARGE SUMMARY   FINAL DIAGNOSES:  1. 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  2. Recurrent small bowel obstruction secondary to #1.  3. Borderline hypertension.   OPERATIONS PERFORMED:  Resection of terminal ileum and cecum with primary  anastomosis on November 24, 2003.   HISTORY:  This is a 55 year old black female who was admitted on November 18, 2003, with a 24 to 48 hour history of worsening nausea, right-sided crampy  abdominal pain, but no vomiting.  She continued to have bowel movements, but  of lower volume, no fever.   She has a long-standing history of Crohn's ileitis diagnosed in 1997.  She  has been hospitalized three to four times with partial small bowel  obstruction, most recently one month prior to this current admission.  Each  time, her symptoms would resolve with raising her steroid dose and bowel  rest.   The patient was admitted by Dr. Stan Head and asked me to see the patient  upon admission because of his concern that the patient was not responding to  medical therapy and might require surgical intervention.   She had a small-bowel follow-through on November 10, 2003, which showed ileal  distortion and strictures and dilatation and probable fistulae.  A CT scan  on October 25, 2003, showed thickened terminal ileum, but no abscess.  A  colonoscopy on October 29, 2003, showed normal colon and edema of the terminal  ileum.  Upper endoscopy on October 29, 2003, was normal.   PHYSICAL EXAMINATION:  GENERAL:  A pleasant, but overweight black female in  moderate  distress.  She was ambulatory.  VITAL SIGNS:  Temperature was 98.5, heart rate 124, respiratory rate 20,  blood pressure 152/101.  LUNGS:  Clear to auscultation.  ABDOMEN:  Soft, not distended, normal bowel sounds.  She was tender in the  right lower quadrant, but there was no mass and no rebound.   LABORATORY DATA:  Abdominal x-ray showed one dilated loop of small bowel in  the mid abdomen.   HOSPITAL COURSE:  On admission, the patient was placed on bowel rest with  intravenous steroids.  I was asked to see her, and I felt that she was a  surgical candidate.  Over the next couple of days her symptoms improved.  A  PICC line was started for hyperalimentation because of anticipated surgery  and prolonged n.p.o. status.  A CT scan was obtained which really looked  okay.  We did  not see any abscess, and there was no significant thickening  of the bowel.  I had a long discussion with the patient regarding her  disease.  She was frustrated with recurrent hospitalizations for small bowel  obstruction.  The fixed abnormalities on the small bowel series were  discussed.  We elected to schedule her surgery.  She underwent a modified  bowel prep.  She was taken to the operating room on November 24, 2003.  She  underwent exploration with findings of fistula formation thickening, in  fact, creeping, and probably stricturing in the last two or three feet of  small bowel.  I resected about three feet of terminal ileum and the cecum  and did a primary anastomosis.   The final pathology report showed active inflammation consistent with  Crohn's disease.  Lymph nodes were benign.  Some stricturing was identified.   Postoperatively, the patient did fairly well.  We kept her on  hyperalimentation, but weaned her steroids down fairly rapidly.  We advanced  her diet and activities steadily over several days until she was able to  tolerate a regular diet and had resumed bowel movements.   She was discharged  on November 29, 2003.  At that time, she wanted to go home,  was having loose stools, was tolerating a regular diet, her wounds were  healing nicely.  Her PICC line was removed and the TNA was discontinued.  She was sent home on prednisone 10 mg a day and Vicodin.  She is to follow  up with Dr. Russella Dar regarding steroid taper.  She is to return to see me in  the office in 7 to 9 days to remove her staples.                                               Angelia Mould. Derrell Lolling, M.D.    HMI/MEDQ  D:  12/07/2003  T:  12/07/2003  Job:  [id number]   cc:   Venita Lick. Russella Dar, M.D. [facility]   Betti D. Pecola Leisure, M.D.  [address]  [place]  [place] [address]  Fax: [phone]

## 2010-09-22 NOTE — H&P (Signed)
NAME:  Betty Stewart                         ACCOUNT NO.:  [id number]   MEDICAL RECORD NO.:  [id number]                   PATIENT TYPE:  INP   LOCATION:  1610                                 FACILITY:  [facility]   PHYSICIAN:  Iva Boop, M.D. [facility]           DATE OF BIRTH:  10/05/1955   DATE OF ADMISSION:  10/26/2003  DATE OF DISCHARGE:                                HISTORY & PHYSICAL   CHIEF COMPLAINT:  Abdominal pain, nausea and vomiting.   HISTORY OF PRESENT ILLNESS:  This is a 55 year old African American female  with a history of Crohn's ileitis and a history of partial small-bowel  obstructions.  She has had multiple abdominal pelvic surgeries.  A small-  bowel follow-through, in June 2003, confirmed a tight stricture in the  terminal ileum.  She was discharged from [name] practice, in 2004,  probably for noncompliance with medical therapy.  She then was seen by Dr.  Russella Dar during an admission of March 29, 2003 to April 02, 2003, when  she was treated for a partial small-bowel obstruction.  She resolved with  medical therapy including IV steroids, pain meds, and limited p.o.  Her  medical compliance since then has been variable.  She did make it to her  initial followup office visit with Dr. Russella Dar in early December, but was  supposed to followup with him at six weeks and never made the appointment to  do so.  She has been taking suboptimal amounts of her Pentasa.  She has  continued on prednisone 20 daily for the most part.  She has Darvocet-N 100  available if she gets pain.  About a week ago, she started developing  recurrent abdominal pain with nausea and vomiting.  She was seen Tuesday in  the emergency room, and on Sunday through Monday, which is yesterday, she  was also seen in the emergency room.  Labs were unremarkable.  CT scan,  performed yesterday, showed mild thickening in the terminal ileum.  Constipation but no blatant small-bowel obstruction.  There  were some  air/fluid levels in the colon.  She was sent home with prescriptions for  Phenergan and, I believe, Percocet both of which she had from her Tuesday  visit to the ED.  She was advised to use some Milk of Magnesia to relieve  the constipation.  She did this and tried MiraLax as well as an over the  counter laxative but has not managed to have a bowel movement yet.  Last  night she was continuing to throw up and threw up apple juice.  The pain is  not well controlled with the Percocet, so she came back to the emergency  room, where we are now seeing her.   ALLERGIES:  DEMEROL and/or DILAUDID cause pruritus in the past, not clear  which of these, so they are both listed as allergies.   MEDICATIONS:  1. Pentasa 250 mg,  two in the morning, two in the afternoon, and one in the     evening.  She is supposed to be taking it 1,000 mg t.i.d.  2. Prednisone 20 mg daily.  Note that last month she took a two week hiatus     from the prednisone while she was on the Atkin's Diet but restarted     prednisone two weeks ago.  3. Aspirin 81 mg daily.  4. Ibuprofen approximately 600 mg daily.  5. MiraLax as needed.  6. Darvocet-N 100 as needed.  7. Percocet as needed.  8. OTC laxatives as needed.   SOCIAL HISTORY:  She works part-time at [employer].  She has been in [employer] but is out for the summer.  Rarely drinks alcohol.  Does not  smoke.  Has two children.   FAMILY HISTORY:  Includes stroke, diabetes mellitus type 2, coronary artery  disease, hypertension.  There is no family history of GI disease.   PAST MEDICAL HISTORY:  1. Crohn's ileitis with stricture.  2. Partial small-bowel obstruction.  3. Status post total abdominal hysterectomy.  4. Bilateral salpingo-oophorectomy.  Note that one ovary had been removed     prior to the hysterectomy and final oophorectomy.  5. Status post appendectomy.  6. Status post C-section twice.  7. Status post cholecystectomy.  8. History  of hypertension as a teen but not an active problem recently.   REVIEW OF SYSTEMS:  UROLOGIC:  No dysuria, no hematuria.  Maybe some  oliguria in the last 24 hours.  MUSCULOSKELETAL:  Denies joint pain or  swelling.  DERMATOLOGIC:  No rash.  No itching.  GI:  No reflux symptoms.  No dysphagia.  No hematemesis.  CARDIOVASCULAR:  No pedal edema.  No chest  pain.  No palpitations.  ENDOCRINE:  No excessive thirst.  No unexplained  weight loss.  Did loose 10 pounds with the Atkin's Diet last month.  ID:  No  fevers, no sweats.   PHYSICAL EXAMINATION:  VITAL SIGNS:  Temperature 98.4, pulse 100, blood  pressure 134/98, respirations 20.  GENERAL:  The patient is alert, in no obvious distress.  She is overweight.  HEENT:  Negative pallor.  Extraocular movements intact.  Oropharynx is moist  and clear.  COR:  There is a regular rate and rhythm.  No murmurs, rubs, gallops.  ABDOMEN:  Soft with mild tenderness, a bit more in the epigastric region but  also present in the bilateral mid abdomen.  There is no distention.  Bowel  sounds are active.  No increased tympani.  There is no hepatosplenomegaly.  RECTAL:  Notable for an empty vault.  No stool present.  Tone within normal  limits.  EXTREMITIES:  No cyanosis, clubbing, or edema.  PSYCHIATRIC:  She is appropriate.  Not tearful.  NEUROLOGIC:  She is alert and oriented  x 3.  There is no tremor.  She is  moving all four.  Her grip and pedal strength are 5/5 bilaterally.  DERMATOLOGIC:  No evidence for rash or ulcers on extremities or trunk.   LABS:  White blood cell count 8.3, hemoglobin 15.9, hematocrit 45.9,  platelets 255,000.  Sodium 136, potassium 4.3, BUN 11, creatinine 1.1,  glucose 124.  Total bilirubin 1.1, alkaline phosphatase 126, AST 41, ALT 33,  albumin 3.9.  Amylase 373, lipase 31.  Urinalysis shows 30 mg of protein, 7- 10 white blood cells, 0-2 red blood cells, a few bacteria, moderate  leukocyte esterase, and no nitrites.  IMPRESSION:  1. Abdominal pain with nausea, vomiting.  Suspect early partial small-bowel     obstruction, possible secondary to active Crohn's ileitis versus     secondary to known stricturing in the terminal ileum that is chronic.     Her liver function tests are slightly elevated and this was not the case     in the last two lab assays over the past week.  Her gallbladder is out.     There is no evidence for any pancreatitis or biliary dilatation on the     computed tomography scan, so not clear that the mild elevation of liver     function tests represents anything worrisome.  2. Constipation.  This again is likely secondary to partial small-bowel     obstruction.  3. Medical noncompliance.  4. Possible adrenal insufficiency, iatrogenic in a patient who has been     taking chronic steroids at moderate doses for the last seven months.  5. Mild hyperglycemia in a patient on chronic steroids, and she does have a     family history of diabetes mellitus, type 2.   PLAN:  The patient to be admitted for IV steroids, full dose Pentasa,  morphine and Phenergan as needed.  We will give tap water enemas to try to  move any stool in the colon.  Diet is limited to sips of clear liquids.  She  will be allowed p.o. medications.      Jennye Moccasin, P.A. [facility]                   Iva Boop, M.D. [facility]    SG/MEDQ  D:  10/26/2003  T:  10/27/2003  Job:  [id number]

## 2010-09-22 NOTE — Procedures (Signed)
[place]  Patient:    Betty Stewart Visit Number: [id number] MRN: [id number]          Service Type: END Location: ENDO Attending Physician:  Rich Brave Dictated by:   Florencia Reasons, M.D. Proc. Date: 10/06/01 Admit Date:  10/06/2001                             Procedure Report  PROCEDURE PERFORMED:  Colonoscopy with biopsies.  ENDOSCOPIST:  Florencia Reasons, M.D.  INDICATIONS FOR PROCEDURE:  History of Crohns ileitis, who has moved to this area and has not previously been evaluated by [place].  Outpatient records pertained to her previous Crohns diagnosis have been sought but are not currently available.  The patient was hospitalized six weeks ago with an exacerbation of Crohns ileitis characterized by ileal thickening on the CT scan.  She had been off medications for about six months prior to that admission.  She responded promptly to IV steroids, was switched to oral steroids, and these have been tapered such that she has been off all steroid therapy for about the past week, apart from Entocort.  FINDINGS:  Minimal ileitis.  DESCRIPTION OF PROCEDURE:  The nature, purpose and risks of the procedure had been reviewed with the patient, who provided written consent.  Sedation was fentanyl 90 mcg and Versed 8 mg IV without arrhythmias or desaturation.  The Olympus adjustable tension pediatric video colonoscope was advanced quite easily to the cecum and from there to about 10 cm up into a fairly normal-appearing terminal ileum with perhaps just a little tiny bit of cobblestoning and a focal patch of exudate.  There was also a minimal amount of sandpaper erythema.  At the site of exudate, there may have been a very shallow ulceration, but overall, the terminal ileum looked quite good. Numerous random biopsies were obtained as the scope was withdrawn.  The quality of the prep was excellent in the colon except for the fact  the patient had had some red jello as part of her prep which left a reddish tint that almost looked like hemorrhaging weeping from the mucosa but in all areas where this was washed off, it was nonadherent and the underlying mucosa of the colon looked normal, smooth and glistening and for the most part had a normal vascular mucosal pattern, so it is not felt that any colitis was present.  No polyps, cancer, vascular malformations or diverticular disease were noted either.   Retroflexion in the rectum was unremarkable.  Pullout through the anal canal showed some internal hemorrhoids but not evidence of anal canal ulcerations or other problems.  Random colonic mucosal biopsies were obtained during pullback.  The patient tolerated the procedure well and there were no apparent complications.  IMPRESSION:  Minimal residual ileitis on Entocort therapy.  No evident colitis.  PLAN:  Await pathology.  Continue Entocort for now. Dictated by:   Florencia Reasons, M.D. Attending Physician:  Rich Brave DD:  10/06/01 TD:  10/07/01 Job: [id number]

## 2010-09-22 NOTE — H&P (Signed)
NAME:  Betty Stewart NO.:  [id number]   MEDICAL RECORD NO.:  [id number]                   PATIENT TYPE:  EMS   LOCATION:  ED                                   FACILITY:  [facility]   PHYSICIAN:  Iva Boop, M.D. [facility]           DATE OF BIRTH:  05/06/1956   DATE OF ADMISSION:  11/18/2003  DATE OF DISCHARGE:                                HISTORY & PHYSICAL   CHIEF COMPLAINT:  Abdominal pain and Crohn's disease.   HISTORY:  This is a 55 year old African-American woman with known Crohn's  ileitis.  She was admitted to the hospital recently at the end of June and  was discharged after about a week's hospitalization.  During that  hospitalization, she did have a colonoscopy that demonstrated inflammatory  changes in the distal ileum.  She had improved on IV steroids, antiemetics,  and antibiotics.  She was discharged on oral prednisone.  She also had an  upper endoscopy that was negative, to my recollection.  She was due to see  Dr. Russella Dar tomorrow but complained of the acute onset of right lower quadrant  pain that started in the last couple of days.  This worsened, and despite  increasing her prednisone from 20 to 80 mg, it was persistent.  She has been  nauseated but has not vomited.  Bowel movements are less frequent, as is  urination.  She is not eating much at all in the last day or so because of  this pain.  She had a small bowel series as part of her evaluation.  This  was performed on July 6th, which showed dilation of a loop of bowel in the  right lower quadrant and narrowing in the region of the terminal ileum with  multiple segments of the distal small bowel having a featureless appearance  and poor definition of the mucosal fold pattern.  The terminal ileum was  distorted and narrowed, suggesting a possible sinus tract or fistulae.  There were lengthy dilated loops of small bowel in the distal ileum,  question a chronically diseased, burn-out  segment.  She denies fever.  She  thinks she passed a little bit of blood with a bowel movement last week but  has not had a lot of that.   Her review of systems is essentially unchanged from her previous admission.  She has been somewhat noncompliant in the past, one must admit.  She had  recently been on an Atkins diet.  She did not complain of rash or itching.  No significant joint pain.  Urination is off, as far as amount, but there is  no dysuria.  All other systems are negative at this time.   DRUG ALLERGIES:  DEMEROL and DILAUDID have caused pruritus in the past.  She  thinks it was the DILAUDID more so than DEMEROL.   HOME MEDICATIONS:  Prednisone, as mentioned.  She was on Pentasa.  She was  supposed to be on 1000 mg t.i.d.  Darvocet, multivitamin, and aspirin.   PAST MEDICAL HISTORY:  Includes the Crohn's disease.  Previously a patient  of [name].  Now a patient of our practice.  Dr. Russella Dar has been her  gastroenterologist, although he has not seen her much to date.  There is a  history of a prior abdominal hysterectomy and bilateral salpingo-  oophorectomy with a unilateral oophorectomy before the hysterectomy and  final oophorectomy.  Appendectomy, C-section x2, cholecystectomy, remote  history of hypertension as a teenager.   FAMILY HISTORY:  Stroke.  Diabetes mellitus.  Coronary artery disease.  Hypertension.  No gastrointestinal problems.   SOCIAL HISTORY:  She works part-time at [employer].  She has been in [employer].  She rarely drinks alcohol.  No smoking.  Two children.   PHYSICAL EXAMINATION:  VITAL SIGNS:  Blood pressure 153/101, temp 98.5,  pulse 124, respirations 20.  Oxygen saturation 97% on room air.  GENERAL:  A pleasant but somewhat ill middle-aged black woman.  HEENT:  Eyes are anicteric.  Mouth free of lesions.  Posterior pharynx  clear.  NECK:  Supple.  No thyromegaly or mass.  LUNGS:  Clear.  HEART:  S1 and S2.  No murmurs, rubs or  gallops.  Somewhat tachycardic.  ABDOMEN:  Slightly obese.  Soft.  Bowel sounds are present but not high-  pitched.  There is no obvious mass, but she clearly has moderate tenderness  in the right lower quadrant.  There is no rebound or organomegaly.  RECTAL:  Deferred.  LYMPH NODES:  No neck, supraclavicular, or inguinal nodes palpated.  EXTREMITIES:  No peripheral edema in the lower extremities.  SKIN:  Without rash.  NEUROLOGIC:  She is alert and oriented x 3.   LAB DATA:  CMET shows an elevated glucose at 127.  An alkaline phosphatase  of 122.  Otherwise normal.  Her CBC shows a leukocytosis with a white count  of 11.6, hemoglobin 16.4, platelets normal.  There is a left shift on the  white count.  Her lipase is normal.  Her amylase is 225, which is elevated.   An acute abdominal series shows focal dilation of a loop of bowel in the  right mid and lower quadrant with air/fluid level.  Chest is clear.  This is  self-viewed by me.   ASSESSMENT:  Crohn's disease with chronic severe problems, most likely  stricturing of the ileum.  I need to check her upper endoscopy and  colonoscopy reports.  I am working from [employer] on that.  At any rate, she is  not responding to prednisone therapy.  She needs admission to the hospital  for IV fluids, IV Flagyl, and/or Cipro and steroids.  Surgical consultation  is appropriate given the chronicity of this problem.  I do not think  Remicade would be a good option for this lady for a variety of reasons, and  I think surgical consultation is advised to see if a resection would provide  a long-term benefit.  She will be given morphine and Phenergan for  antiemetics and pain control.  She will be continued on IV fluids and will  have lab followup.  I think her blood pressure elevation is likely from  pain, and we will follow that at this point.  Iva Boop, M.D. [facility]    CEG/MEDQ  D:  11/18/2003  T:   11/18/2003  Job:  [id number]

## 2010-09-22 NOTE — Discharge Summary (Signed)
NAME:  Betty Stewart                         ACCOUNT NO.:  [id number]   MEDICAL RECORD NO.:  [id number]                   PATIENT TYPE:  INP   LOCATION:  5735                                 FACILITY:  [facility]   PHYSICIAN:  Wilhemina Bonito. Marina Goodell, M.D. [facility]             DATE OF BIRTH:  Dec 06, 1955   DATE OF ADMISSION:  03/29/2003  DATE OF DISCHARGE:  04/02/2003                                 DISCHARGE SUMMARY   ADMITTING DIAGNOSES:  1. Partial small bowel obstruction associated with Crohn's ileitis.  The     patient has narrowing in the terminal ileum from previous small-bowel     follow-through testing.  Could possibly have bowel obstruction from     adhesions and possibly from stricturing at the ileum.  2. History of Crohn's ileitis with checkered history of medical compliance     but by patient report, currently compliant with medications for the last     3-4 weeks.  3. History of prior small bowel obstructions.  4. Status post total abdominal hysterectomy and bilateral salpingo-     oophorectomy.  5. Status post appendectomy.  6. Status post cesarean section twice.  7. Status post cholecystectomy.  8. History of hypertension.  9. Mildly increased alkaline phosphatase, rule out bony versus     gastrointestinal source.   DISCHARGE DIAGNOSES:  1. Crohn's ileitis with partial small bowel obstruction, question secondary     to stricture versus stenosis.  Possibly bowel obstruction secondary to     adhesions from multiple prior abdominopelvic surgeries.  Symptoms     improved.  2. Hypertension.  3. Pruritus secondary to DEMEROL versus DILAUDID.  These drugs need to be     listed as potential allergies.  The patient did tolerate morphine.  4. Leukocytosis, resolved.  5. Mild to moderate hyperglycemia, rule out adult onset diabetes mellitus,     type 2.  6. Hypokalemia, resolved.  7. Elevated alkaline phosphatase with normal 5' nucleotidase, making this     elevation likely secondary to a  bony source.   BRIEF HISTORY:  Betty Stewart is a 55 year old African-American female, who  has above-noted history of Crohn's ileitis and prior history of partial  small bowel obstructions.  Previously followed up until recently by Dr.  Matthias Hughs but apparently recently discharged over problems with finances and  accounting issues.  She had just been hospitalized October 17 through the  19th with a partial small bowel obstruction and was treated with IV  corticosteroids.  Up until that time, she had not been particularly  compliant with medications but since that time, she has been taking the  Entocort and Pentasa.  A prior small-bowel follow-through on October 13, 2001,  showed a tight 15 cm stricture in the terminal ileum.  The patient presented  to the emergency room with complaints of nausea, vomiting, and epigastric  pain for about a half a day.  Dr. Judie Petit  Russella Dar was covering unassigned GI  call and admitted the patient for supportive care.   CONSULTATIONS:  None.   LABORATORY DATA:  White blood cell count initially 18.4000, resolved to a  level of 5.6 at discharge.  Hemoglobin initially 15.1.  It was 12.6 at  discharge.  Hematocrit 36.3 at discharge.  Platelets 215,000.  Hematocrit  36.3 at discharge.  Differential on the initial CBC showed a left shift.  Erythrocyte sedimentation rate was 37.  Sodium was 139; potassium was 4.0.  It did drop at one point to 3.2 but was 3.8 at discharge.  Glucose ranged  from 109 up to 129.  BUN was 11, creatinine 1.0.  Total bilirubin 0.4,  alkaline phosphatase 140.  AST 26, ALT 19.  Amylase 259, lipase 22.  5'  nucleotidase was 15.  Urine pregnancy test was negative.  Urinalysis was  unremarkable.  Only 0-2 white blood cells, a few bacteria, few epithelials,  and trace leukocyte esterase present.   IMAGING STUDIES:  Acute abdominal series with chest November 22, showed  partial small bowel obstruction and no acute chest or lung findings.  Follow-   up films of November 24, showed no significant change compared with previous  study.   HOSPITAL COURSE:  Betty Stewart was admitted to nontelemetry bed.  She was  started on IV Solu-Medrol, IV Protonix, Demerol.  Demerol and Phenergan were  available as needed.  Overnight, she required a change from Demerol to  Dilaudid because she developed some itching when given the Demerol.  The  itching continued and was notable after being given the Dilaudid as well, so  this was also discontinued.  Ultimately, morphine was required for pain  control and was successfully tolerated.  She was initially kept NPO and  supported with IV fluids.  Diet was steadily advanced from clear liquids up  to full liquids which she tolerated.  Pain decreased significantly but did  not completely resolve.  She was no longer having any nausea or vomiting and  wanted to go home by hospital day five.  She was discharged to home in  stable condition.  Abdominal exam was nondistended, nontender, with active  bowel sounds at the time of discharge.   MEDICATIONS AT DISCHARGE:  1. Prednisone 40 mg daily in the a.m. to be kept on this dose until seen     back in the office by Dr. Russella Dar.  2. Pentasa 250 mg 4 pills q.i.d.  3. Aspirin 81 mg daily.  4. Clonidine which was 3 mg 1daily.  5. Darvocet-N 100, 1 p.o. q.4-6h. p.r.n.  6. Tylenol p.m. as needed for sleep.  7. Multivitamin once daily.   DIET:  Low residue.   RETURN OFFICE VISIT:  With Dr. Claudette Head, Friday, December 9, at 10:45.      Jennye Moccasin, P.A. [facility]                   John N. Marina Goodell, M.D. [facility]    SG/MEDQ  D:  04/20/2003  T:  04/20/2003  Job:  [id number]   cc:   Jocelyn Lamer D. Pecola Leisure, M.D.  [address]  [place]  [place] [address]  Fax: [phone]   Venita Lick. Russella Dar, M.D. [facility]

## 2010-09-22 NOTE — Assessment & Plan Note (Signed)
INTERVAL HISTORY:  [name] is a pleasant 55 year old African-American  female who was seen for the first time in our clinic on May 29, 2005.  She was sent to our clinic for pain management of her low back pain.   She has a 10-year history of low back pain and has history of a grade 1-2  anterior listhesis of L4 on L5 and degenerative disk disease at L4-5, L5-S1.  [name] is accompanied by her daughter today.   [name] states that the tramadol she was put on at the last visit does  seem to help, it lasts about 45 minutes.  She was able to start working out  on a treadmill working out four to five times a week going 35-45 minutes at  a time.   She has trialed the Neurontin.  She believes it might make her a bit more  moody but she is otherwise tolerating it.  She believes the oxycodone does  not help much at all.  She has used her last pills up.  She came to [place] on  this particular medication and is not requesting any kind of refill on this  medicine.  She continues to use ibuprofen three to four tablets a day taking  it typically 400 mg twice a day.  She has trialed using a stationary bike,  does seem to increase her low back pain also.  Her average pain is variable  between 7 and 9 typically located in the low back pain, radiates to the  buttocks into the posterior thigh area and down the back of the leg.  She  also has some lateral hip pain as well.   Pain is typically worse with walking, bending, inactivity, prolonged  standing; improves with rest, a little bit with medication; she reports fair  to good relief with the current meds that she is on.   FUNCTIONAL STATUS:  The patient is walking about 10-15 minutes at a time  currently.  Steps are difficult for her but she is able to climb them.  She  does drive.  She is independent with her self-care, needs some assistance  with higher level ADLs such as meal prep, household duties, shopping.   REVIEW OF SYSTEMS:   Complains of numbness, tremor, tingling, intermittent  spasms, no changes otherwise in review of systems.   PAST MEDICAL/SOCIAL/FAMILY HISTORY:  Unchanged since our last visit.   EXAMINATION:  Blood pressure today 135/85, pulse 86, respirations 16, 99%  saturated on room air.  She is a mildly obese African-American woman who  does not appear in any distress.  She is oriented x3.  Her affect is bright,  alert, cooperative, pleasant, quite well spoken.  She was able to stand up  easily in the room.  Her gait is not particularly antalgic but does appear a  bit stiff and shorter stride length is noted.  Normal heel-toe mechanics are  observed however.  Seated her reflexes are 0 at the patella tendons and  Achilles tendons.  No abnormal tone is noted.  She has good strength at the  hip flexors, knee extensors, dorsiflexors, plantar flexors.  She has quite a  bit of tenderness throughout the lateral hip area bilaterally, worse on the  left than on the right and throughout some of the gluteal musculature.   IMPRESSION:  1.  Degenerative disk disease L4-5, L5-S1.  2.  Grade 1-2 anterior listhesis L4-5, L5.  3.  Facet arthropathy.  4.  Left sciatica.  5.  History of Crohn's disease.  6.  Hypertension, currently controlled.  7.  Left trochanteric bursitis/iliotibial band syndrome.   PLAN:  Today we will increase her Neurontin to 300 mg one p.o. t.i.d. and  then at bedtime for 3 days and then up to two p.o. q.i.d. and I will  dispense (#240).  We will also increase her tramadol to 50 mg two p.o. twice  daily (#120) given.  She has been referred to Dr. Wynn Banker for epidural  steroid injection, however, apparently she related an incident at a previous  injection where she might have had some intolerance to the injection,  apparently Dr. Wynn Banker is looking into this and will reschedule her when  the reaction to that particular incident is clarified and it is deemed safe  that she can undergo  this particular procedure without any difficulty.   Long term goals for her would be to get her back into a routine of aerobic  conditioning and we would like to get her involved in a physical therapy  program over the next couple of months.  She understands this and is  interested in pursuing this as well.  She is quite interested in weight loss  and maintaining her overall physical condition.  We will see her back then  in a month.           ______________________________  Brantley Stage, M.D.     DMK/MedQ  D:  06/29/2005 13:27:34  T:  06/30/2005 95:28:41  Job #:  [id number]

## 2010-09-22 NOTE — Discharge Summary (Signed)
NAME:  Betty Stewart                         ACCOUNT NO.:  [id number]   MEDICAL RECORD NO.:  [id number]                   PATIENT TYPE:  INP   LOCATION:  1610                                 FACILITY:  [facility]   PHYSICIAN:  Iva Boop, M.D. [facility]           DATE OF BIRTH:  1955/08/22   DATE OF ADMISSION:  10/26/2003  DATE OF DISCHARGE:  10/29/2003                                 DISCHARGE SUMMARY   ADMISSION DIAGNOSIS:  1. Abdominal pain with nausea and vomiting, suspect early partial small     bowel obstruction, rule out obstruction secondary to active Crohn's     ileitis versus secondary to stricturing associated with terminal ileal     Crohn's.  2. Slight elevation of liver function tests.  3. History of medical noncompliance.  4. Rule out adrenal insufficiency, the patient has been  using chronic     steroids at moderate doses over the last several months.  5. Mild hyperglycemia from patient using chronic steroids, rule out diabetes     mellitus type 2, positive family history for diabetes mellitus type 2.  6. History of Crohn's ileitis with stricture.  7. History of partial small bowel obstruction.  8. Status post total abdominal hysterectomy and unilateral salpingo-     oophorectomy.  9. Status post subsequent contralateral salpingo-oophorectomy.  10.      Status post appendectomy.  11.      Status post C-section twice.  12.      Status post cholecystectomy.  13.      History of hypertension as a teenager but no active hypertension     recently, on this admission, her systolic blood pressure is 98.   DISCHARGE DIAGNOSIS:  1. Abdominal pain with nausea and vomiting probably secondary to early     partial small bowel obstruction versus secondary to constipation.  2. History of Crohn's ileitis associated with stricturing seen on a small     bowel follow through in June 2003.  Rule out ongoing stricture     contributing to obstructive symptoms.  3. Possible urinary tract  infection treated with three days antibiotics     during this admission.  4. Status post colonoscopy with biopsy of the terminal ileum.  5. Epigastric pain status post normal upper endoscopy.  6. Hyperglycemia, mild, probably secondary to chronic steroid use.  7. Minor elevation of alkaline phosphatase and SGOT, CT scan performed on     October 25, 2003, showing no evidence for any biliary or pancreatic     abnormalities.   CONSULTATIONS:  None.   PROCEDURE:  1. Upper endoscopy on October 29, 2003, by Dr. Stan Head, this study was     normal to second portion of duodenum.  2. Colonoscopy on October 29, 2003, showed scattered diverticulosis of the     sigmoid colon, grade 1 internal hemorrhoids, and some edema in the     terminal ileum but  no erosions or ulcers.  Dr. Leone Payor was only able to     intubate a few cm into the terminal ileum.  Biopsies of the erythematous     area showed patchy hyperemia with microhemorrhages but no active     inflammation, ulceration, or granulomas noted on the biopsy.   BRIEF HISTORY:  [name] is a 55 year old African American female who has  a history of Crohn's ileitis and partial small bowel obstruction.  She  presently was a patient of [name] but had been discharged from his  practice due to medical noncompliance.  She has had a tight stricture noted  in the terminal ileum on small bowel follow through in June 2003.  She had  been treated as an inpatient November 22 through November 26 by Dr. Russella Dar  for partial small bowel obstruction.  Treatment strategies included IV  steroids, pain control, and bowel rest.  Her symptoms resolved and she had  been discharged on prednisone and Pentasa.  She has had one follow up office  visit with Dr. Russella Dar in early December but had not made it back to his  office since then.  She has been using suboptimal levels of Pentasa with 500  mg taken in the morning and in the afternoon and 250 mg in the evening  whereas  the recommended schedule had been for 1000 mg three times daily.  She had been taking prednisone chronically at 20 mg a day.  Other  medications included aspirin and ibuprofen.  The patient had had periodic  pain but nothing that lead her to seek medical attention.  About a week  prior to this admission, she developed more severe abdominal pain along with  nausea and vomiting.  She had been seen at least a couple of times in the  emergency room the week prior to her admission.  Labs were unremarkable and  a CT scan which had been performed on the day prior to admission showed some  mild thickening in the terminal ileum.  There was constipation with some air  fluid levels in the colon seen but no obvious bowel obstruction.  She was  sent home and did not do well despite medication with Percocet, Phenergan,  MiraLax, and over the counter laxatives as well as a clear liquid diet.  She  was continuing to throw up and ended up back in the emergency room  where  Dr. Stan Head admitted her for further treatment.   LABORATORY DATA:  Hemoglobin initially 15.9, corrected to 13.7, hematocrit  went from 45.9 to 39.7, MCV 88.2, platelets 255,000.  On differential, there  was a left shift present.  Fecal occult blood was negative.  White blood  cell count was 8.3.  Sodium was 136, potassium 4.3, glucose ranged 120 to  124.  BUN 11, creatinine 1.1.  Albumin 3.2.  SGOT went from 41 to 37.  ALT  went from 33 to 21.  Alkaline phosphatase went from 126 to 106.  Total  bilirubin from 1.1 to 1.2.  Urinalysis showed a moderate amount of leukocyte  esterase, a few epithelial cells, 7-10 white blood cells, and 0-2 red blood  cells.  There were a few bacteria and some hyaline casts present along with  30 mg protein.  A urine culture was not obtained.   HOSPITAL COURSE:  The patient's initial treatment was that of a full dose of Pentasa 1000 mg three times daily, Solu-Medrol 40 mg IV twice daily, and  p.r.n.  Phenergan and morphine.  She was allowed sips clear liquids and ice  chips initially.  Her BUN was elevated when she came in and she was hydrated  with half normal saline following administration of normal saline in the  emergency room.  CT scan the day prior to admission had not shown any  specific bowel obstruction.  It did show the constipation.  The x-rays of  June 21 showed, again, a nonobstructive bowel gas pattern and stool within a  mildly distended colon.  Follow up films on hospital day two showed  improving gas distention of the small bowel more prominent along with  constipation in the colon.  She did not require NG tube suction.  She was  given Dulcolax suppository.  She still had some nausea so we held the  Pentasa.  This resulted in passing some stool which resulted in clinical  improvement in her GI complaints.  Urinalysis returned and was equivocal for  urinary tract infection, so we elected to treat her with IV and later oral  Cipro.  The patient had more stool following readministration of Dulcolax  suppositories.  Within 36 hours, she was tolerating clear liquids and after  that a soft diet.  The patient did complain of epigastric discomfort and was  treated with Protonix as well as antacids.  Dr. Leone Payor performed  colonoscopy and upper endoscopy on June 24, the results are noted above.  Following these studies, she was discharged to home with plans for a small  bowel follow through to be performed on July 6.  She had a return office  visit set up with Dr. Russella Dar on November 18, 2003.  She was given prescriptions  for all necessary medications and these included the following.   DISCHARGE MEDICATIONS:  1. Prednisone 30 mg daily for seven days then 20 mg daily until seen back in     the office by Dr. Russella Dar.  2. Pentasa 250 mg, 4 tablets three times daily.  3. Prilosec over the counter 1 daily.  4. Darvocet N100, 1-2 every 4 hours as needed, #60 were dispensed.  5.  Promethazine 25 mg 1 q.8h. as needed, #30 were dispensed.   She was advised not to continue use of aspirin or ibuprofen.  She was OK to  return to work on Tuesday, June 28.  Recommended diet was low residue.   CONDITION ON DISCHARGE:  Improved.      Jennye Moccasin, P.A. [facility]                   Iva Boop, M.D. [facility]    SG/MEDQ  D:  12/21/2003  T:  12/21/2003  Job:  [id number]

## 2010-09-22 NOTE — H&P (Signed)
NAME:  Betty Stewart                         ACCOUNT NO.:  [id number]   MEDICAL RECORD NO.:  [id number]                   PATIENT TYPE:  INP   LOCATION:  0381                                 FACILITY:  [facility]   PHYSICIAN:  Althea Grimmer. Luther Parody, M.D.            DATE OF BIRTH:  [birth year]   DATE OF ADMISSION:  02/21/2003  DATE OF DISCHARGE:                                HISTORY & PHYSICAL   HISTORY OF PRESENT ILLNESS:  [name] is a 55 year old female who  presents to the emergency room complaining of epigastric and right lower  quadrant pain with nausea and vomiting.  She describes the pain in the  epigastrium as colicky and the pain in the right lower quadrant as constant.  She has a previously established diagnosis of Crohn's disease since 1997.  She moved to the [place] area a few years ago and was seen by Dr. Bernette Redbird in the spring and summer of 2003.  She presented in April 2003 to  the hospital with a distal small-bowel obstruction which responded to  medical care.  In June of 2003, she underwent a colonoscopy that  demonstrated minimal ileitis and no colonic involvement of Crohn's.  She  underwent a small-bowel series at that time; a small hiatal hernia was noted  and there was a 15-cm ileal string sign with a relatively normal terminal 5  cm of the ileum.  It appears that she is somewhat noncompliant with  medications.  She has been treated Pentasa in the past and does not know why  it was stopped.  She apparently has taken Entocort and prednisone, which she  manages herself at home and uses on an as-needed (p.r.n.) basis.  She  apparently has taken 60 mg of prednisone in the last two days, but has not  been on any medications in the recent past.  She has not vomited any blood;  two weeks ago, she had hematochezia.  She has had no weight loss, chills or  fevers.   PAST MEDICAL HISTORY:  1. Past medical history is pertinent for Crohn's ileitis.  2. She is status  post a total abdominal hysterectomy and bilateral     oophorectomy.  3. She had a cholecystectomy.  4. She has had two C-sections and an incidental appendectomy.   CURRENT MEDICATIONS:  Prednisone and Entocort on a p.r.n. basis, managed by  the patient.   ALLERGIES:  None reported.   FAMILY HISTORY:  Family history is negative for colorectal neoplasia or  inflammatory bowel disease.  She has two adult children.   SOCIAL HISTORY:  She is a nonsmoker; she used to occasionally smoke socially  years ago.  She has a few drinks twice a month.   REVIEW OF SYSTEMS:  GENERAL:  No weight loss or night sweats.  ENDOCRINE:  No history of diabetes or thyroid problems.  SKIN:  No rash or pruritus.  EYES:  No icterus or change in vision.  ENT:  No aphthous ulcers or chronic  sore throat.  RESPIRATORY:  No shortness of breath, cough or wheezing.  CARDIAC:  No chest pain, palpitations or history of valvular heart disease.  GI:  As above.  GU:  No dysuria or hematuria.  Remainder of review of  systems is negative.   PHYSICAL EXAMINATION:  GENERAL:  She is a well-developed, overweight adult  female in no acute distress, though seeming somewhat uncomfortable.  VITAL SIGNS:  Afebrile.  Blood pressure 156/102, pulse 107, respiratory rate  20.  SKIN:  Skin is normal.  HEENT:  Eyes anicteric.  Oropharynx unremarkable, mucous membranes moist.  NECK:  Neck is supple without thyromegaly.  There is no cervical or inguinal  adenopathy.  CHEST:  Chest sounds clear.  HEART:  Heart sounds regular rate and rhythm.  ABDOMEN:  Abdomen may be mildly distended.  There is tenderness in the  epigastrium and the right lower quadrant.  There are hypoactive bowel sounds  present.  I do not appreciate any masses.  RECTAL:  Exam is not performed.  EXTREMITIES:  Extremities are without cyanosis, clubbing, edema, or rash.   LABORATORY TESTS:  Laboratory tests reveal a hemoglobin of 1.59, a white  blood count of 9.4 with  a relative leukocytosis of 87% polys; platelet count  is normal at 293,000.  Electrolytes are normal, BUN 15, creatinine 1.  Liver  function tests are normal, amylase 202.   IMPRESSION:  [age]-year-old female with previously diagnosed Crohn's  ileitis.  She seems to be having a flare and likely has a partial small-  bowel obstruction, which is seen on an abdominal x-ray series with a few air-  fluid levels.  It would appear that she is somewhat noncompliant with  followup and with her medications.  She reports to me that whenever she has  a flare of a small-bowel-obstruction-type picture, her pancreatic enzymes go  up, yet she has never been diagnosed with pancreatitis on a CT scan.  She  also says that her blood pressure goes up with steroids and this will need  to be followed.   PLAN:  She is placed on intravenous Solu-Medrol, ciprofloxacin and Flagyl,  along with Entocort and Pentasa orally.  Serial abdominal x-rays will be  followed.  Consideration will be given to a CT scan and a surgical  consultation if the bowel obstruction progresses.  Please see the orders.                                               Althea Grimmer. Luther Parody, M.D.    PJS/MEDQ  D:  02/21/2003  T:  02/22/2003  Job:  [id number]   cc:   Jocelyn Lamer D. Pecola Leisure, M.D.  [address]  [place]  [place] [address]  Fax: [phone]   Bernette Redbird, M.D.  [address]  [place], [place] [address]  Fax: [phone]   Llana Aliment. Malon Kindle., M.D.  [address], [address]  [place]  [place] [address]  Fax: [phone]

## 2010-09-22 NOTE — Assessment & Plan Note (Signed)
[name] is a 55 year old black female who is being seen in our pain and  rehabilitative clinic for low back pain, posterior leg pain.   She has a history of degenerative lumbar disk disease, grade 2  anterolisthesis L4 on L5, facet arthropathy, and intermittent sciatic  symptoms. She was started on Topamax last month. She took it for several  days and went to a b.i.d. dosing schedule and noted heart racing, tiredness,  and fatigue. She weaned back down and discontinued the medications. She was  off two weeks, but while she was off she felt that the medication did seem  to be helping her pain, so she restarted the medication herself again and re-  trialed it within a month's time and notes that this time she is back up to  b.i.d. dosing and does have no side-effects at this time, and does note an  improvement in overall pain with these medications. She still takes four to  six Ultram a day and reports some relief with this medication as well.   Her average pain is about a 6 on a scale of 10. It is localized to the low  back, posterior thigh region. It is worse with walking, bending activities.  Sleep is fair.   She can walk between 15 and 20 minutes at a time. She is able to climb  stairs and drive. She is independent with her self-care.   No change in the past medical, social, or family history since her last  visit. She does have a history of Crohn's disease and has had bowel  resection here at [facility] back in 2005. Regarding her social history, she  does spend approximately 15 minutes discussing some family issues with me  today regarding her husband; apparently he has a substance abuse problem and  has been incarcerated. He had been back with her recently, however she has  now asked him to leave. She is quite upset about these circumstances today.  However, she will not go into much detail surrounding the circumstances of  his leaving.   PHYSICAL EXAMINATION:  Blood  pressure 124/70, pulse 99, respirations 17,  100% saturation on room air. She is a mildly obese black female who appears  her stated age. She is oriented times there. Affect is bright, alert,  cooperative, and pleasant. She transitions from sit to stand without any  difficulty. Gait is normal. Balance is good. Romberg and tandem gait are  within normal limits. Reflexes are diminished in the lower extremities.  Motor strength is good, however. No new sensory deficits are appreciated.   IMPRESSION:  1.  Degenerative lumbar disk disease, L4-5, L5-S1.  2.  Grade 2 anterolisthesis, L4-5.  3.  Facet arthropathy.  4.  Intermittent left sciatica.  5.  History of Crohn's disease.  6.  Hypertension.  7.  Left trochanteric bursitis/__________.   PLAN:  Will continue her on her current medications. She has several refills  on Topamax and she continues to take at 25 mg b.i.d. We will refill her  Ultram 50 mg two p.o. b.i.d., #120, with one refill. She has concerns about  financial constraints in the upcoming months. She would like to consider  epidural, however she is not sure she will be able to afford that. Will have  her discuss further with nursing staff who may refer to some social services  today.          ______________________________  Brantley Stage, M.D.    DMK/MedQ  D:  09/21/2005 13:09:44  T:  09/21/2005 23:04:49  Job #:  [id number]

## 2010-09-22 NOTE — Group Therapy Note (Signed)
[name] is a 55 year old married African American female who is  accompanied by her husband today.  She has been sent by Dr. Artist Pais for pain  management of her low back pain and left leg pain.   [name] has an approximately 10-year history of low back pain and also  she has a 10-year history of Crohn's disease.  She has undergone removal of  her intestines, approximately four feet, and has had a bowel obstruction  with the last year as well secondary to the Crohn's and adhesions.  Ms.  Betty Stewart states her average pain is between an 8 and a 9 on a scale of 10.  She has been worse over the last several weeks.  She had undergone an  epidural injection back in September of 2006, which provided her with  several months of relief it seems.  Up until a few weeks ago, she had been  doing some work on a treadmill and her pain was down to between a 5 or 7 on  a scale of 10.   She has been treated with multiple pain medications over the years including  tramadol, Soma, Advil, Duragesic and Aleve.  She  has been on Neurontin at  one point but did not feel it helped. The maximum dose she was on was 600  twice a day for a total of 1200 a day.  Her pain is localized to especially  the left low back area to the left buttock and down the leg.  She does get  some intermittent numbness, tingling, pins and needles sensation into the  bottom of the foot and the lateral left foot.   She has had lumbar x-rays which were done on October 13, 2004, which showed an  8 mm anterolisthesis of L4 on L5 with degenerative disc disease.  Degenerative disc disease also at L4-5 as well as 5-S1 and with the L4-5  facet joints. There was felt to be some facet arthropathy.   She also has left hip films which showed no acute changes of the same date.   Her sleep is overall poor.  She does get some leg spasms that night.  Pain  is typically worse throughout the day over the last several weeks.  She gets  fair relief with  current medications that she is on.  She is really limited  to not more than 10 to 15 minutes of standing at this point.  She has given  up doing an kind of treadmill work at this point as well.   She can climb stairs and she does drive. She is independent with feeding,  dressing bathing, toileting.  Her husband does quite a bit of meal prep and  household duties and shopping.   REVIEW OF SYSTEMS:  Positive for numbness, spasms and trouble walking.  Also  positive for nausea, vomiting, constipation.   She has concerns about being on narcotic pain medications because of the  increased constipation and her concern about worsening Crohn's disease.   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   PAST SURGICAL HISTORY:  1.  Positive for C. section February 19, 1977.  2.  May 22, 1978, ovaries removed.  3.  Hysterectomy.  4.  Gallbladder removed.  5.  Parathyroidectomy.  6.  Four feet of intestine removed.  7.  Bowel obstruction last year.   SOCIAL HISTORY:  She is married.  Lives with her husband. Denies smoking or  alcohol.   FAMILY HISTORY:  Positive for heart disease, diabetes, and blood pressure.   PHYSICAL EXAMINATION:  GENERAL APPEARANCE:  She is a mildly obese African  American woman who appears her stated age.  She is oriented x3.  Her affect  is alert, cooperative and pleasant.  VITAL SIGNS:  Blood pressure 124/70, pulse 90, respirations 18, 100%  saturated on room air.  NEUROLOGIC:  She does not appear in any distress during our interview while  she is sitting.  However, when she stands up for me she does appear quite  uncomfortable.  Decreased weightbearing especially in the left lower  extremity.  She had some difficulty with tandem gait simply because her left  leg bothers her when she puts pressure through it.  Limitations in lumbar  range of motion in all planes.   Straight leg raising is negative, however,  when she is  seated, her reflexes are diminished in patellar and Achilles  tendons.  She has intact sensation with pinprick throughout both lower  extremities.  Motor strength is 5/5 in both lower extremities.  No obvious  focal weakness is noted.   IMPRESSION:  1.  Degenerative disc disease L4-5, L5-S1.  2.  Grade I to II anterior listhesis L4 and L5 8 mm.  3.  Facet arthropathy.  4.  Left sciatica.  5.  History of Crohn's disease.  6.  Hypertension, controlled.   Patient has been treated in the past with multiple pain medications  including some nonsteroidal anti-inflammatory medications.  She reports  feeling of palpitations with both Mobic and Aleve.  She is able to take  Advil, however.  She currently is taking OxyContin 10 mg twice a day and is  getting some relief with that.  He had been on tramadol in the past, felt  she had gotten a little more relief with that medication but she has not  taken it for a while.   Apparently she did quite well with the epidural steroid injection she  received back in September of 2006, got several months' relief with that.  She would like to pursue repeat epidural steroid injection.  Will try to get  her set up with Dr. Wynn Banker in the next couple of weeks.  Will also add  back the Neurontin.  Will gradually increase her over the next couple of  weeks until she is up to 1200 mg a day and may increase that at our next  visit as well.  Will also add tramadol back to her medications 50 mg one  p.o. daily p.r.n. back and leg pain #30.  At the next visit, may consider  weaning her off of the OxyContin especially if she is doing much better with  the epidural injection.  Will also consider adding a nonsteroidal back, most  likely Advil in the morning.  Will consider TENS unit, physical therapy evaluation with an emphasis on the lower extremity musculature.  She had  many tender points throughout the  gluteal and left lateral hip musculature  today.  Would also consider having her husband attend to get some education  regarding soft tissue techniques and massage to assist her.  Will check  urine drug screen as well next visit.           ______________________________  Brantley Stage, M.D.     DMK/MedQ  D:  05/28/2005 14:12:05  T:  05/29/2005 09:51:11  Job #:  [id number]

## 2013-04-26 ENCOUNTER — Emergency Department (INDEPENDENT_AMBULATORY_CARE_PROVIDER_SITE_OTHER)
Admission: EM | Admit: 2013-04-26 | Discharge: 2013-04-26 | Disposition: A | Discharge status: Home / Self Care | Payer: PRIVATE HEALTH INSURANCE | Source: Home / Self Care | Attending: Family Medicine | Admitting: Family Medicine

## 2013-04-26 ENCOUNTER — Encounter (HOSPITAL_COMMUNITY): Payer: Self-pay | Admitting: Emergency Medicine

## 2013-04-26 DIAGNOSIS — J069 Acute upper respiratory infection, unspecified: Secondary | ICD-10-CM

## 2013-04-26 MED ORDER — BENZONATATE 100 MG PO CAPS: 100.0000 mg | ORAL_CAPSULE | Freq: Three times a day (TID) | ORAL | Status: AC | PRN

## 2013-04-26 NOTE — ED Notes (Signed)
C/o cold sx for five days now States she has a dry cough, stuffy nose, sore throat, sneezing and fever Asa, tylenol and other OTC medications was used as treatment

## 2013-04-26 NOTE — ED Provider Notes (Signed)
CSN: [id number]     Arrival date & time 04/26/13  1158 History   First MD Initiated Contact with Patient 04/26/13 1302     Chief Complaint  Patient presents with  . URI   (Consider location/radiation/quality/duration/timing/severity/associated sxs/prior Treatment) Patient is a 57 y.o. female presenting with URI. The history is provided by the patient.  URI Presenting symptoms: congestion, cough, fever, rhinorrhea and sore throat   Severity:  Mild Onset quality:  Gradual Duration:  5 days Progression:  Improving Chronicity:  New Ineffective treatments:  OTC medications Associated symptoms: sneezing   Associated symptoms: no headaches, no myalgias, no sinus pain, no swollen glands and no wheezing     History reviewed. No pertinent past medical history. No past surgical history on file. No family history on file. History  Substance Use Topics  . Smoking status: Not on file  . Smokeless tobacco: Not on file  . Alcohol Use: Not on file   OB History   Grav Para Term Preterm Abortions TAB SAB Ect Mult Living                 Review of Systems  Constitutional: Positive for fever.  HENT: Positive for congestion, rhinorrhea, sneezing and sore throat.   Eyes: Negative.   Respiratory: Positive for cough. Negative for wheezing.   Cardiovascular: Negative.   Gastrointestinal: Negative.   Genitourinary: Negative.   Musculoskeletal: Negative for myalgias.  Skin: Negative.   Allergic/Immunologic: Negative for immunocompromised state.  Neurological: Negative for headaches.    Allergies  Hydromorphone hcl  Home Medications  No current outpatient prescriptions on file. BP 124/75  Pulse 101  Temp(Src) 98.4 F (36.9 C) (Oral)  Resp 18  SpO2 96% Physical Exam  Nursing note and vitals reviewed. Constitutional: She is oriented to person, place, and time. She appears well-developed and well-nourished. No distress.  HENT:  Head: Normocephalic and atraumatic.  Right Ear: Hearing,  tympanic membrane, external ear and ear canal normal.  Left Ear: Hearing, tympanic membrane, external ear and ear canal normal.  Nose: Nose normal.  Mouth/Throat: Uvula is midline, oropharynx is clear and moist and mucous membranes are normal.  Eyes: Conjunctivae are normal. No scleral icterus.  Neck: Normal range of motion. Neck supple. No thyromegaly present.  Cardiovascular: Normal rate, regular rhythm and normal heart sounds.   Pulmonary/Chest: Effort normal and breath sounds normal.  Abdominal: Soft. Bowel sounds are normal. She exhibits no distension. There is no tenderness.  Musculoskeletal: Normal range of motion.  Lymphadenopathy:    She has no cervical adenopathy.  Neurological: She is alert and oriented to person, place, and time.  Skin: Skin is warm. No rash noted.  Psychiatric: She has a normal mood and affect. Her behavior is normal.    ED Course  Procedures (including critical care time) Labs Review Labs Reviewed  POCT RAPID STREP A ([facility]ARE ONLY)   Imaging Review No results found.  EKG Interpretation    Date/Time:    Ventricular Rate:    PR Interval:    QRS Duration:   QT Interval:    QTC Calculation:   R Axis:     Text Interpretation:              MDM  Patient feels that her symptoms are improving with the exception of a lingering cough (without associated fever). Exam consistent  with resolving URI. No wheezing or dyspnea. Will treat with tessalon for cough. Advised follow up with PCP if no improvement over next  7-10 days or if fever returns.    Jess Barters [place], [place] 04/26/13 [address] [place], [place] 04/26/13 [phone]

## 2013-04-27 NOTE — ED Provider Notes (Signed)
Medical screening examination/treatment/procedure(s) were performed by a resident physician or non-physician practitioner and as the supervising physician I was immediately available for consultation/collaboration.  Evan Corey, MD    Evan S Corey, MD 04/27/13 0740 

## 2013-04-28 LAB — CULTURE, GROUP A STREP

## 2013-05-21 ENCOUNTER — Other Ambulatory Visit: Payer: Self-pay | Admitting: Internal Medicine

## 2013-05-21 DIAGNOSIS — E2839 Other primary ovarian failure: Secondary | ICD-10-CM

## 2013-06-09 ENCOUNTER — Other Ambulatory Visit: Payer: PRIVATE HEALTH INSURANCE

## 2013-06-15 ENCOUNTER — Other Ambulatory Visit: Payer: PRIVATE HEALTH INSURANCE

## 2013-07-02 ENCOUNTER — Other Ambulatory Visit: Payer: PRIVATE HEALTH INSURANCE

## 2013-07-29 ENCOUNTER — Other Ambulatory Visit: Payer: PRIVATE HEALTH INSURANCE

## 2013-10-16 ENCOUNTER — Other Ambulatory Visit: Payer: Self-pay

## 2013-10-16 DIAGNOSIS — Z1231 Encounter for screening mammogram for malignant neoplasm of breast: Secondary | ICD-10-CM

## 2013-10-28 ENCOUNTER — Other Ambulatory Visit: Payer: PRIVATE HEALTH INSURANCE

## 2013-10-28 ENCOUNTER — Ambulatory Visit: Payer: PRIVATE HEALTH INSURANCE

## 2013-11-18 ENCOUNTER — Ambulatory Visit
Admission: RE | Admit: 2013-11-18 | Discharge: 2013-11-18 | Disposition: A | Discharge status: Home / Self Care | Payer: PRIVATE HEALTH INSURANCE | Source: Ambulatory Visit | Attending: Internal Medicine | Admitting: Internal Medicine

## 2013-11-18 ENCOUNTER — Ambulatory Visit
Admission: RE | Admit: 2013-11-18 | Discharge: 2013-11-18 | Disposition: A | Discharge status: Home / Self Care | Payer: PRIVATE HEALTH INSURANCE | Source: Ambulatory Visit

## 2013-11-18 DIAGNOSIS — E2839 Other primary ovarian failure: Secondary | ICD-10-CM

## 2013-11-18 DIAGNOSIS — Z1231 Encounter for screening mammogram for malignant neoplasm of breast: Secondary | ICD-10-CM

## 2017-01-22 ENCOUNTER — Encounter: Payer: Self-pay | Admitting: Internal Medicine

## 2018-06-05 ENCOUNTER — Encounter: Payer: Self-pay | Admitting: Internal Medicine
